# Patient Record
Sex: Female | Born: 1940
Health system: Southern US, Community
[De-identification: ages and names within clinical notes are randomized; demographics above are authoritative.]

## PROBLEM LIST (undated history)

## (undated) DIAGNOSIS — I447 Left bundle-branch block, unspecified: Secondary | ICD-10-CM

## (undated) DIAGNOSIS — I2510 Atherosclerotic heart disease of native coronary artery without angina pectoris: Secondary | ICD-10-CM

## (undated) DIAGNOSIS — I6529 Occlusion and stenosis of unspecified carotid artery: Secondary | ICD-10-CM

## (undated) DIAGNOSIS — E785 Hyperlipidemia, unspecified: Secondary | ICD-10-CM

## (undated) DIAGNOSIS — I10 Essential (primary) hypertension: Secondary | ICD-10-CM

## (undated) DIAGNOSIS — R600 Localized edema: Secondary | ICD-10-CM

## (undated) DIAGNOSIS — I429 Cardiomyopathy, unspecified: Secondary | ICD-10-CM

## (undated) DIAGNOSIS — K219 Gastro-esophageal reflux disease without esophagitis: Secondary | ICD-10-CM

## (undated) DIAGNOSIS — E119 Type 2 diabetes mellitus without complications: Secondary | ICD-10-CM

## (undated) HISTORY — PX: CORONARY ARTERY BYPASS GRAFT: SHX141

## (undated) HISTORY — PX: ABDOMINAL HYSTERECTOMY: SHX81

---

## 2005-03-13 ENCOUNTER — Encounter: Admission: RE | Admit: 2005-03-13 | Discharge: 2005-03-13 | Payer: Self-pay | Admitting: Internal Medicine

## 2005-05-06 ENCOUNTER — Ambulatory Visit: Payer: Self-pay | Admitting: Cardiology

## 2007-03-10 ENCOUNTER — Ambulatory Visit: Payer: Self-pay | Admitting: Infectious Diseases

## 2008-04-22 ENCOUNTER — Ambulatory Visit: Payer: Self-pay | Admitting: Cardiology

## 2008-04-23 ENCOUNTER — Inpatient Hospital Stay (HOSPITAL_COMMUNITY): Admission: AD | Admit: 2008-04-23 | Discharge: 2008-05-03 | Payer: Self-pay | Admitting: Internal Medicine

## 2008-04-23 ENCOUNTER — Ambulatory Visit: Payer: Self-pay | Admitting: Internal Medicine

## 2008-04-26 ENCOUNTER — Encounter: Payer: Self-pay | Admitting: Internal Medicine

## 2008-04-27 ENCOUNTER — Encounter: Payer: Self-pay | Admitting: Internal Medicine

## 2008-04-28 ENCOUNTER — Ambulatory Visit: Payer: Self-pay | Admitting: Thoracic Surgery (Cardiothoracic Vascular Surgery)

## 2008-05-23 ENCOUNTER — Ambulatory Visit: Payer: Self-pay | Admitting: Cardiology

## 2008-05-26 ENCOUNTER — Encounter
Admission: RE | Admit: 2008-05-26 | Discharge: 2008-05-26 | Payer: Self-pay | Admitting: Thoracic Surgery (Cardiothoracic Vascular Surgery)

## 2008-05-26 ENCOUNTER — Ambulatory Visit: Payer: Self-pay | Admitting: Thoracic Surgery (Cardiothoracic Vascular Surgery)

## 2008-06-22 ENCOUNTER — Ambulatory Visit: Payer: Self-pay | Admitting: Cardiology

## 2009-07-17 DIAGNOSIS — Z951 Presence of aortocoronary bypass graft: Secondary | ICD-10-CM

## 2009-07-17 DIAGNOSIS — I25110 Atherosclerotic heart disease of native coronary artery with unstable angina pectoris: Secondary | ICD-10-CM | POA: Diagnosis present

## 2010-07-21 IMAGING — CR DG CHEST 2V
1 series · 1 of 1 positions shown · non-contrast
Comparison: 04/24/2008

CLINICAL DATA: Heart failure

CHEST - 2 VIEW

[w chest lat]
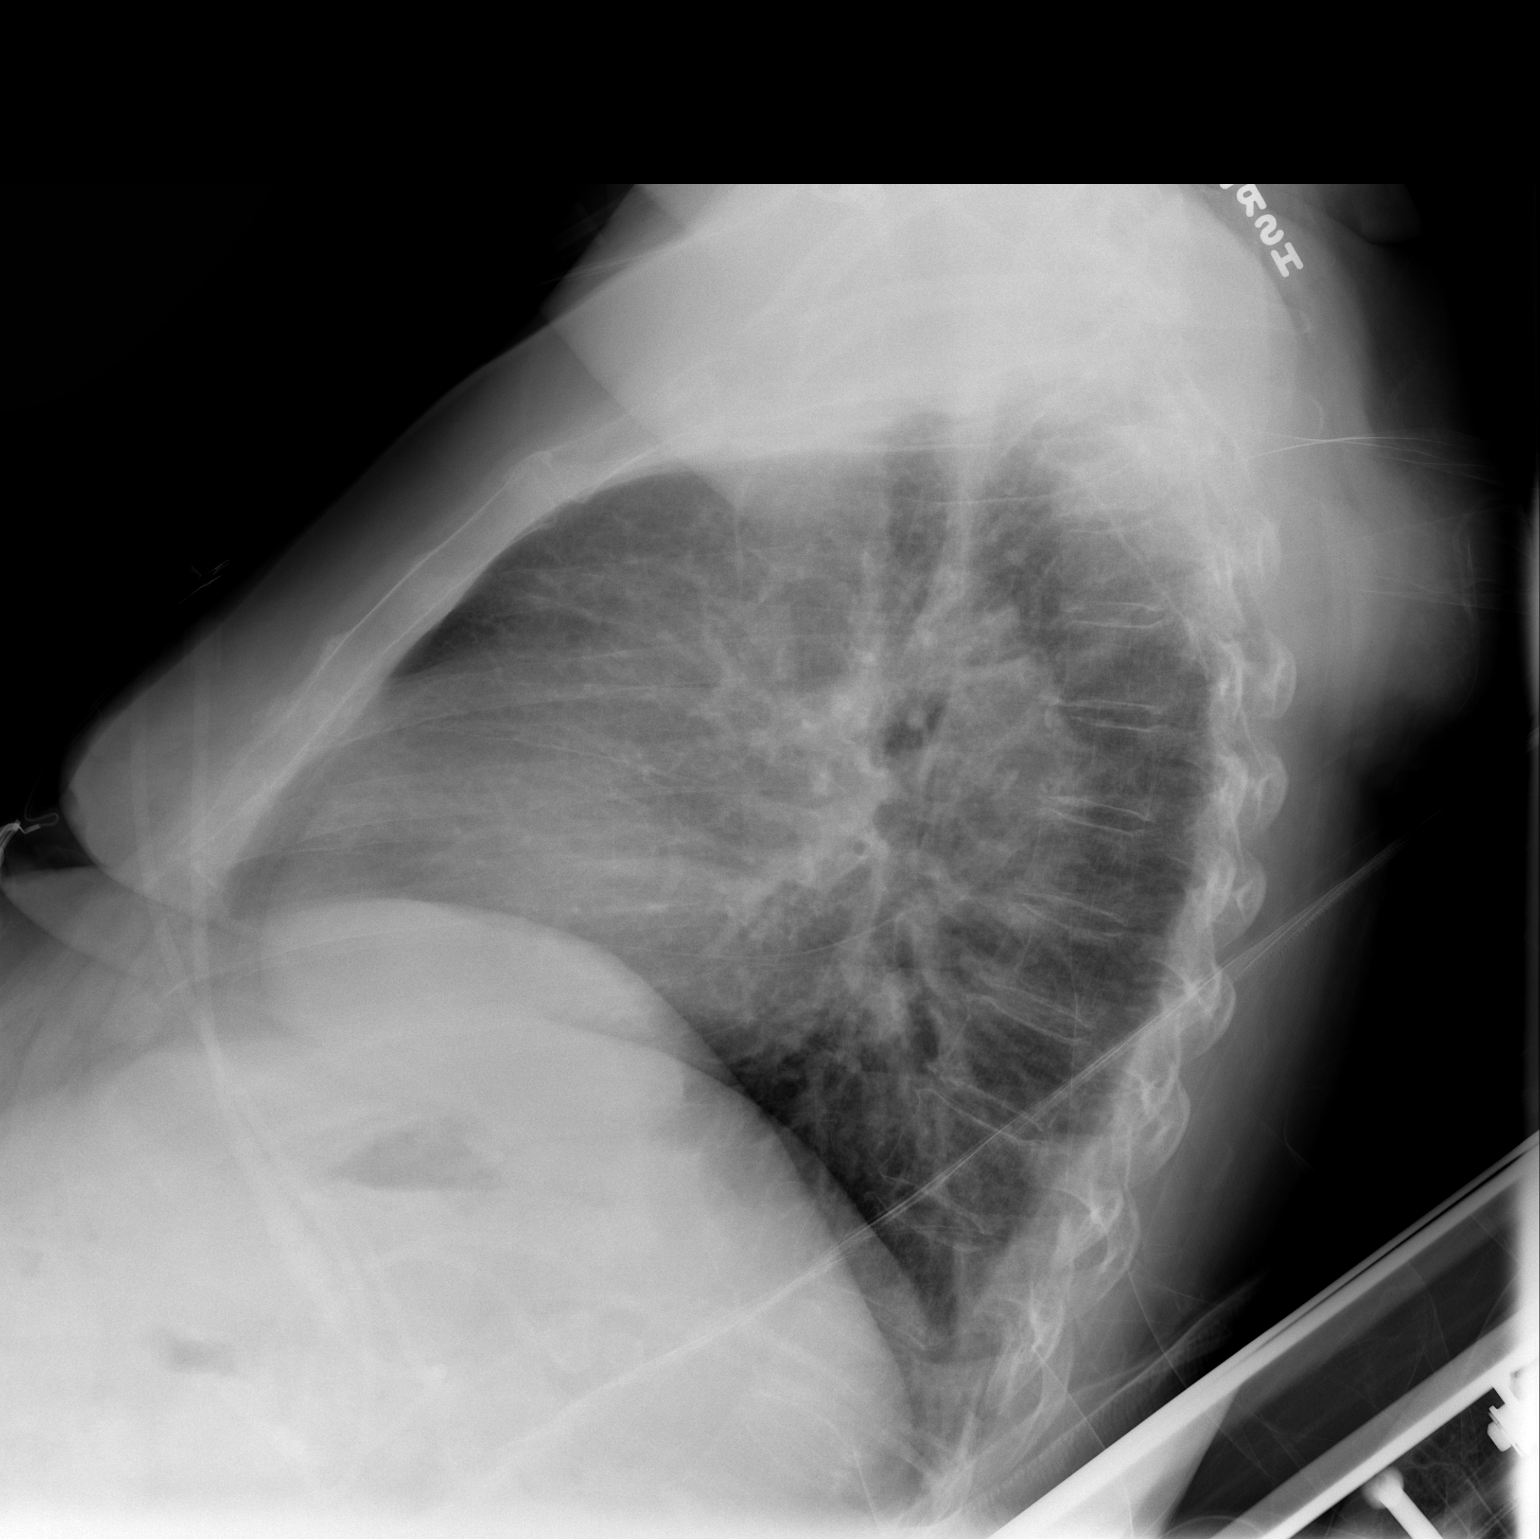

[1 of 1 positions shown; findings below may reference images not displayed]

FINDINGS: Heart size is mildly enlarged.

No pleural effusion or pulmonary interstitial edema.

No airspace densities are identified.

Review of the visualized osseous structures shows no acute
findings.
IMPRESSION: 1.  No acute cardiopulmonary abnormalities

## 2010-07-24 IMAGING — CR DG CHEST 1V PORT
1 series · 1 of 1 positions shown · non-contrast
Comparison: 04/29/2008

CLINICAL DATA: Postop CABG procedure

PORTABLE CHEST - 1 VIEW

[view not recorded]
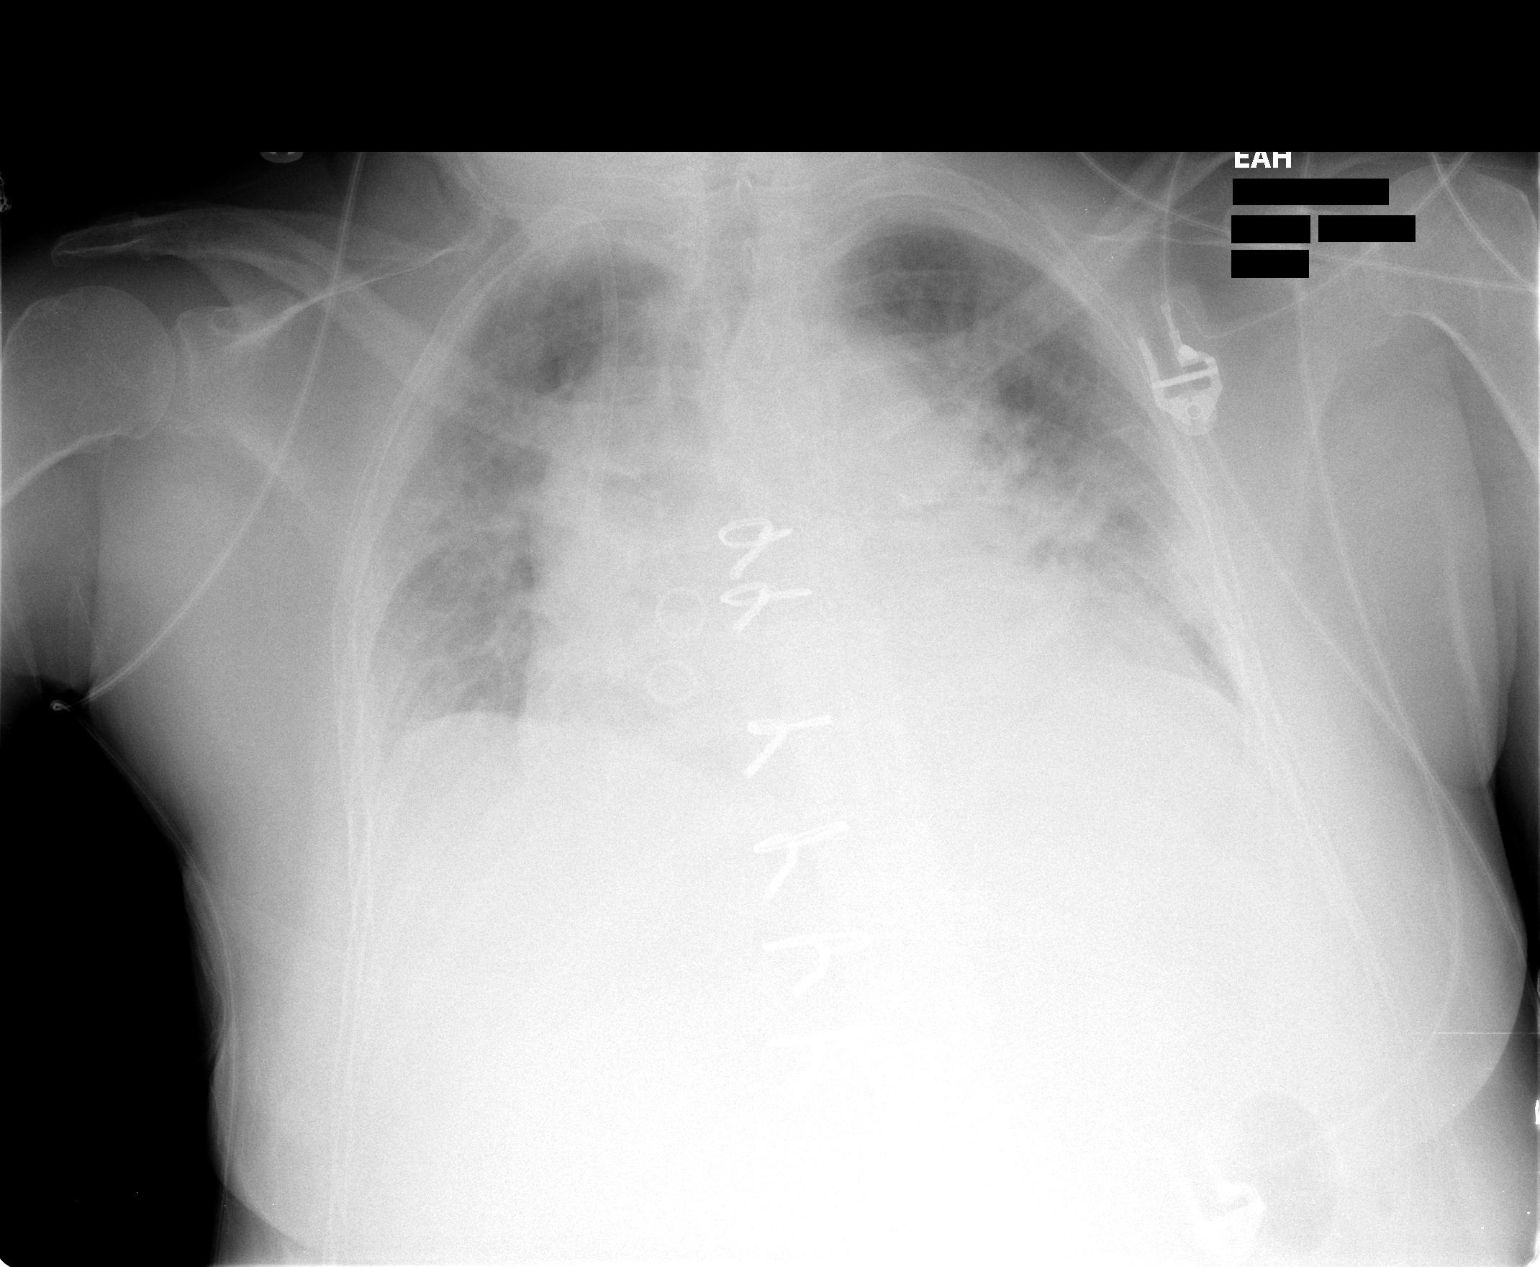

[1 of 1 positions shown; findings below may reference images not displayed]

FINDINGS: The Swan-Ganz catheter and left chest tube have been
removed.  Negative for pneumothorax.  There is progressive
atelectasis with associated increased edema.
IMPRESSION: Negative pneumothorax.  Progressive atelectasis with increased
edema.

## 2010-08-19 IMAGING — CR DG CHEST 2V
2 series · 2 of 2 positions shown · non-contrast
Comparison: Chest x-ray of 05/01/2008

CLINICAL DATA: CABG, follow-up

CHEST - 2 VIEW

[w chest pa]
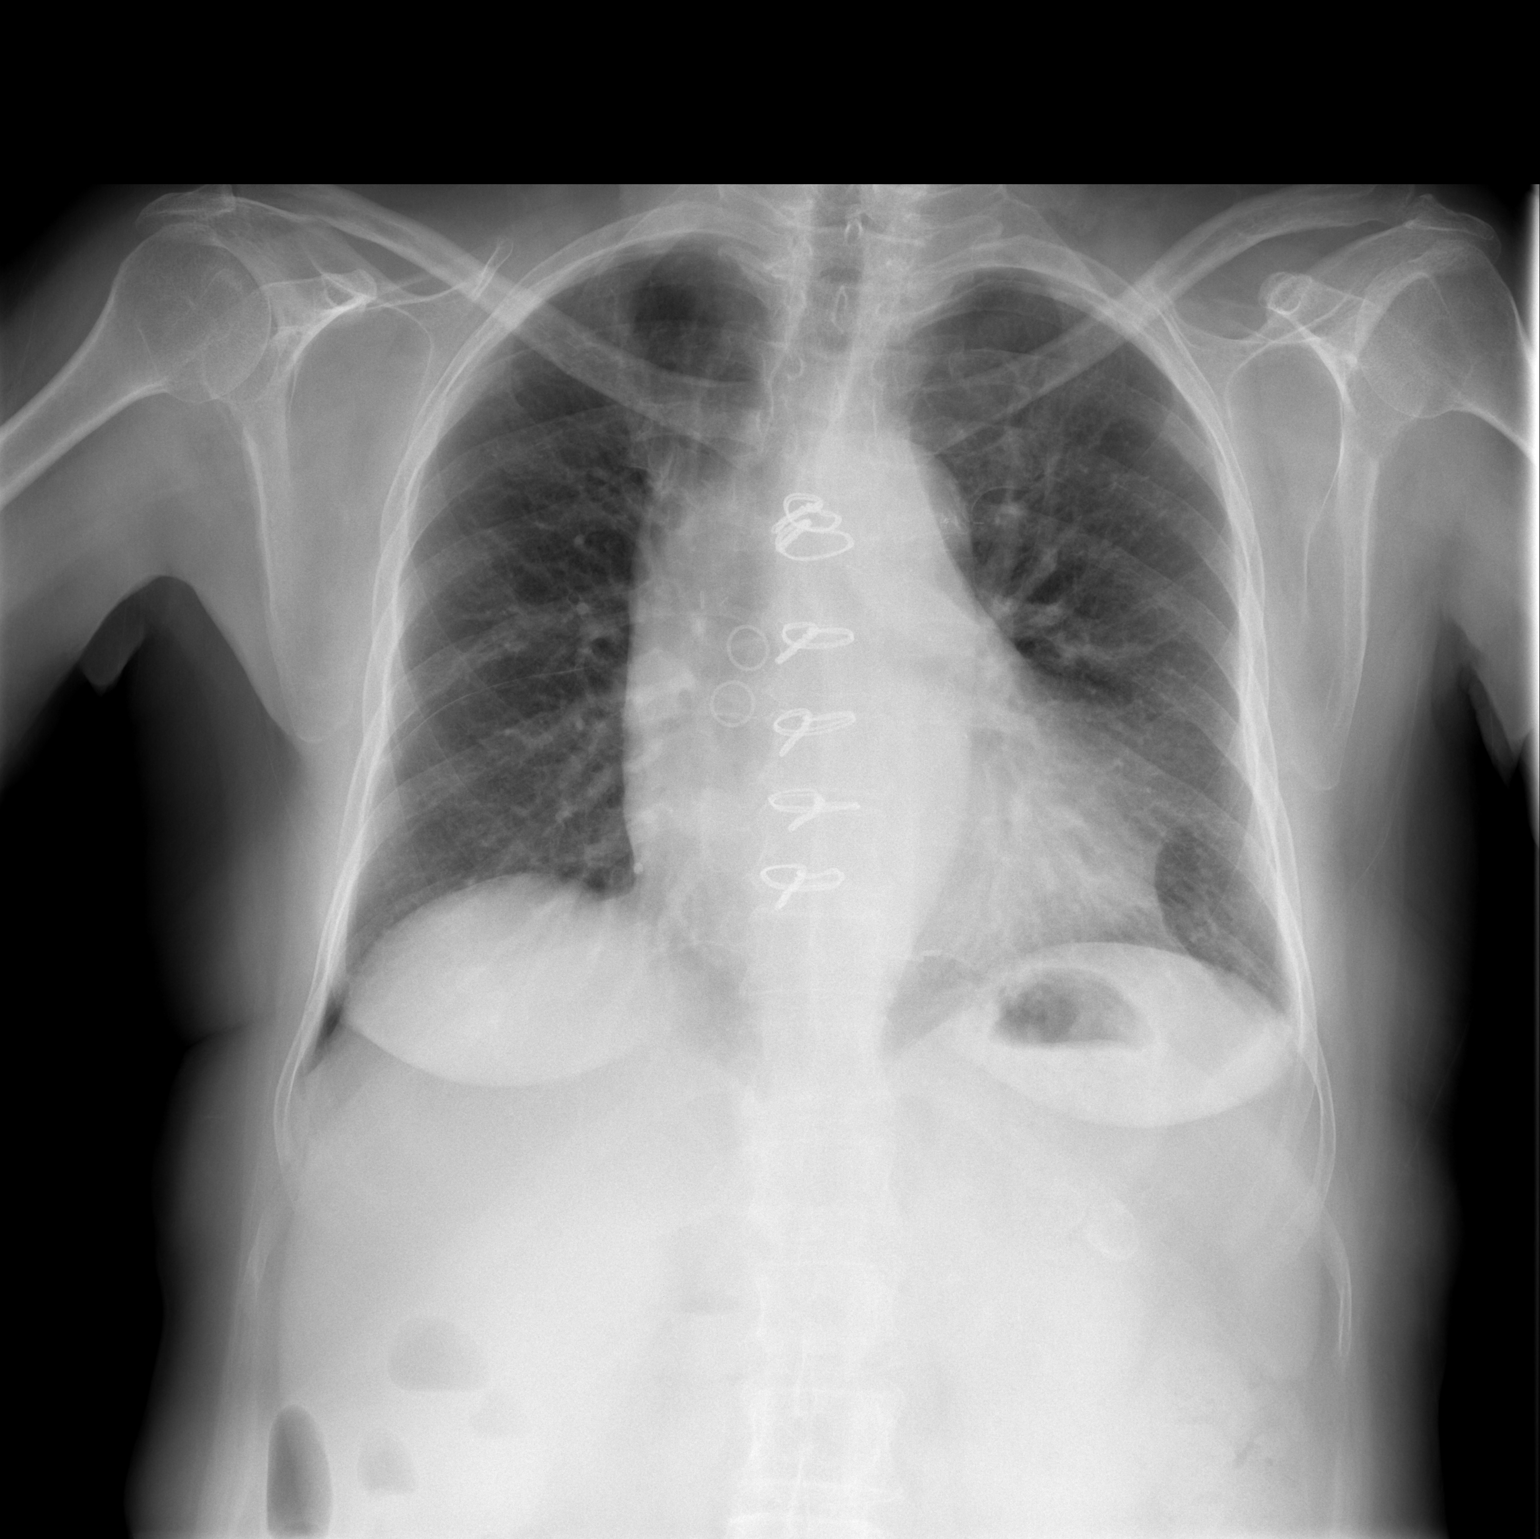

[w chest lat]
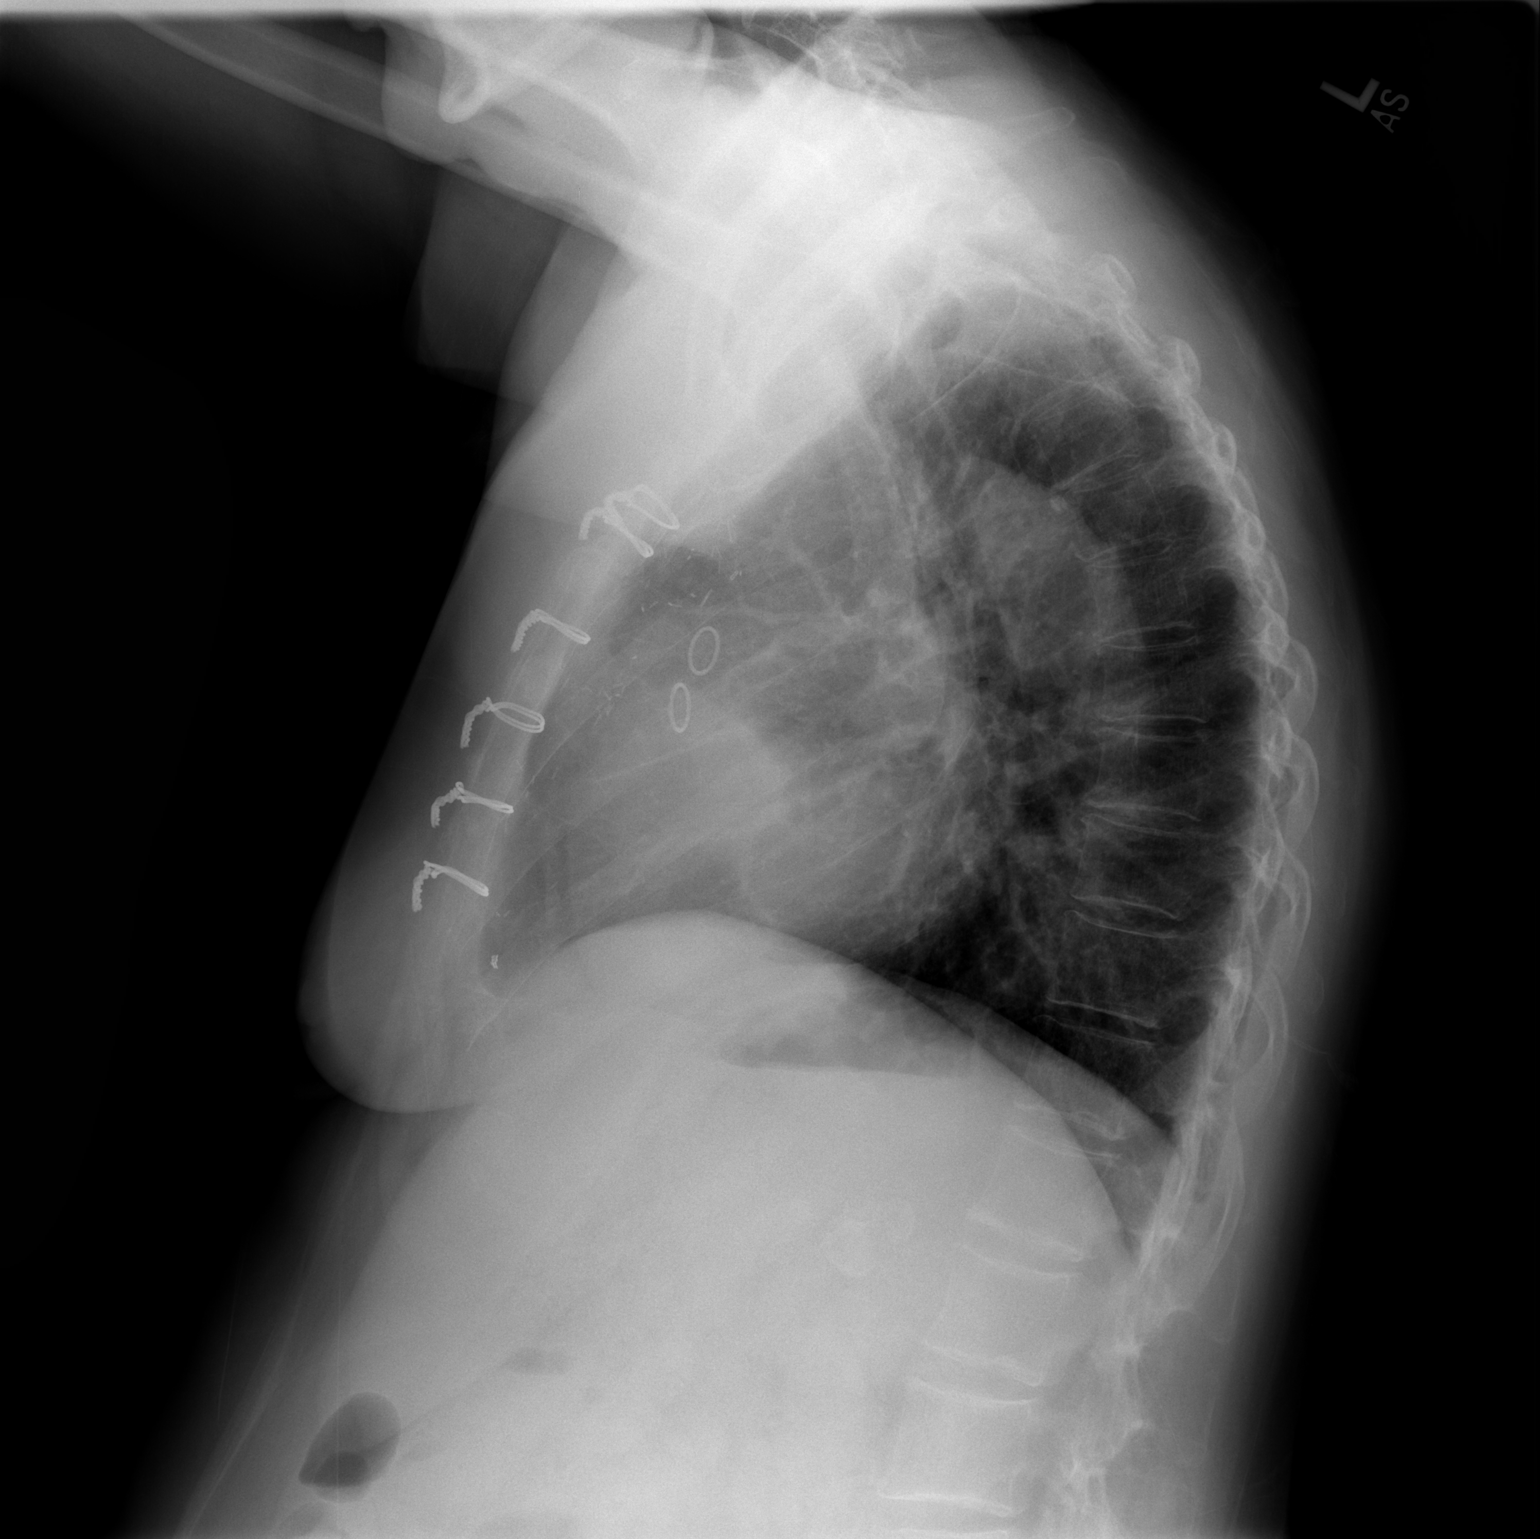

[2 of 2 positions shown; findings below may reference images not displayed]

FINDINGS: Aeration has improved and pulmonary vascular congestion
has resolved.  The heart is mildly enlarged and stable.  Median
sternotomy sutures are noted.
IMPRESSION: No active lung disease.  Resolution of congestion with improved
aeration.

## 2011-01-01 NOTE — Discharge Summary (Signed)
[name], [name]             ACCOUNT NO.:  [id number]   MEDICAL RECORD NO.:  [id number]          PATIENT TYPE:  INP   LOCATION:  2016                         FACILITY:  [facility]   PHYSICIAN:  Salvatore Decent. Dorris Fetch, M.D.DATE OF BIRTH:  [birth year]   DATE OF ADMISSION:  04/23/2008  DATE OF DISCHARGE:                               DISCHARGE SUMMARY   FINAL DIAGNOSIS:  A 3-vessel coronary artery disease with unstable  coronary syndrome.   IN-HOSPITAL DIAGNOSES:  1. Right 40-60% internal carotid artery stenosis.  2. Volume overload postoperatively.  3. Acute blood loss anemia postoperatively.   SECONDARY DIAGNOSES:  1. Congestive heart failure.  2. Nonischemic cardiomyopathy.  3. Type 2 diabetes.  4. Hypertension.  5. Dyslipidemia.  6. Gastroesophageal reflux disease with previous esophageal stricture      status post dilatation.  7. Peripheral edema.  8. Morbid obesity.  9. Status post hysterectomy.   IN-HOSPITAL OPERATIONS AND PROCEDURES:  1. Cardiac catheterization.  2. Coronary artery bypass grafting x3 using a left internal mammary      artery to left anterior descending coronary artery, saphenous vein      graft to acute marginal, saphenous vein graft to obtuse marginal.      Endoscopic vein harvest from the right leg was done.   THE PATIENT'S HISTORY AND PHYSICAL AND HOSPITAL COURSE:  [name] is a  70 year old female with a history of nonischemic cardiomyopathy  previously diagnosed.  She presents with unstable anginal symptoms, and  on cardiac catheterization, she was found to have severe 3-vessel  coronary artery disease.  The patient was advised to undergo coronary  artery bypass grafting.  Dr. Dorris Fetch was consulted.  Dr. Dorris Fetch  saw and evaluated the patient.  He discussed risks and benefits of  undergoing coronary artery bypass grafting.  The patient acknowledged  understanding and agreed to proceed.  Surgery was scheduled for  April 28, 2008.   Preoperatively, the patient did have bilateral  carotid duplex ultrasound done showing on the right to have 40-60% ICA  stenosis.  On the left, she had no evidence of significant ICA stenosis.  She also had preoperative ABIs done showing to be bilaterally greater  than 1.0.  The patient did have episode of chest pain evening prior to  surgery, which improved with sublingual nitroglycerin.  For further  details of the patient's past medical history and physical exam, please  see dictated H&P.   The patient was taken to the operating room on April 28, 2008, where  she underwent coronary artery bypass grafting x3 using left internal  mammary artery to left anterior descending, saphenous vein graft to  acute marginal, saphenous vein graft to obtuse marginal one.  Endoscopic  vein harvest from right leg was done.  The patient tolerated this  procedure well and was transferred to the intensive care unit in stable  condition.  Postoperatively, the patient was noted to be hemodynamically  stable.  She was able to be extubated evening of surgery.  Post-  extubation, the patient was noted to be alert and oriented x4.  Neuro  intact.  Postoperatively, the  patient was able to be weaned from all  drips.  She had minimal drainage from chest tubes and all chest tubes  and lines were discontinued in normal fashion.  She was stabilized in  the intensive care unit.  Blood pressure, heart rate were stable and  normal sinus rhythm.  She was out of bed, ambulating well in the cardiac  rehab.  She had some mild volume overload, started on diuretics for.  The patient also has mild anemia, which was stable.  The patient did not  require transfusions.  She was transferred out to the PCTU on postop day  #2.  The patient's postoperative course continued to improve.  She  remained in normal sinus rhythm.  She was able to be started on a low-  dose beta-blocker.  From the patient's pulmonary status, repeat chest  x-  rays remained stable.  She was able to be weaned off oxygen, sating  greater than 90% on room air.  The patient was encouraged using  incentive spirometer.  She was restarted on Pulmicort.  The patient was  continued on her diuretics for volume overload, which was improving  prior to discharge.  The patient was tolerating diet well.  No nausea or  vomiting noted.  All incisions were clean, dry, and intact and healing  well.  The patient is tentatively ready for discharge home in the next  24-48 hours pending she remains stable.   On postop day #4, the patient was noted to be afebrile.  She is in  normal sinus rhythm.  Blood pressure stable.  O2 sats greater than 90%  on room air.  Last labs showed a white count 11.7, hemoglobin 9.6,  hematocrit 29.1, platelet count 169.  BUN and creatinine of 8 and 0.64.  Potassium was 4.2.   FOLLOWUP APPOINTMENTS:  A followup appointment has been arranged with  Dr. Dorris Fetch for May 26, 2008, at 11:15 a.m..  The patient will  need to obtain PA and lateral chest x-ray 30 minutes prior to this  appointment.  The patient will need to follow up with Dr. Diona Browner in 2  weeks.  She will need to contact this office to make these arrangements.  The patient is known diabetic.  Blood sugars were followed  postoperatively and she was restarted on her home diabetic medications  prior to discharge.   ACTIVITY:  The patient is instructed no driving until released to do so,  no heavy lifting over 10 pounds.  She is told to ambulate 3-4 times per  day, progress as tolerated, and continue her breathing exercises.   INCISIONAL CARE:  The patient is told to shower, washing her incisions  using soap and water.  She is to contact the office if she develops any  drainage or opening from any of her incision sites.   DIET:  The patient is to begin on diet to be low-fat, low-salt, as well  as diabetic diet.   DISCHARGE MEDICATIONS:  1. Estradiol 1 mg daily.   2. Lopid 600 mg b.i.d.  3. Pulmicort 180 mcg as used at home.  4. Amaryl 4 mg daily.  5. Singulair 10 mg daily.  6. Neurontin 600 mg t.i.d.  7. Prilosec 20 mg b.i.d.  8. Zocor 10 mg at night.  9. Actos 15 mg daily.  10.Symbicort 160 mcg as used at home.  11.Metformin 500 mg b.i.d.  12.Aspirin 325 mg daily.  13.Toprol-XL 25 mg daily.  14.Oxycodone 5 mg 1-2 tabs q.4-6 h.  p.r.n.  15.Lisinopril 10 mg daily      Kristen M Fishers, [place]      Salvatore Decent. Dorris Fetch, M.D.  Electronically Signed    KMD/MEDQ  D:  05/02/2008  T:  05/03/2008  Job:  [id number]   cc:   Jonelle Sidle, MD

## 2011-01-01 NOTE — Assessment & Plan Note (Signed)
[facility]                          [facility] OFFICE NOTE   [name], [name]                    MRN:          [id number]  DATE:05/23/2008                            DOB:          [birth year]    PRIMARY CARDIOLOGIST:  Learta Codding, MD, [facility]   REASON FOR VISIT:  Post hospital followup.   HISTORY:  [name] is a very pleasant 70 year old female, recently  referred to [place] in consultation here at [facility] following  presentation with acute systolic heart failure.  A 2D echo showed severe  LVD (EF 25-30%) with normal right ventricular function.  Moreover, the  patient presented with worsening shortness of breath, was felt to be in  acute systolic heart failure, and had an abnormal troponin of 0.64.  Additionally, she presented with a new left bundle branch block.  She  was thus referred directly to [facility] for aggressive management and  subsequent coronary angiography.  Of note, she had not been seen by [place]  in the clinic for quite some time, and had prior history of nonischemic  cardiomyopathy, per prior workup at [facility].  Subsequent studies done here  at [facility], however, suggested normalization of LVF both by  echocardiography as well as nuclear imaging studies in 2004 and 2006.   Following transfer to [facility], cardiac catheterization by Dr. Charlies Constable suggested severe three-vessel CAD and severe LVD (EF 30%) with  global hypokinesis.  There were normal LV filling pressures.   The patient was subsequently referred for surgical evaluation and  underwent successful three-vessel CABG, by Dr. Charlett Lango, on  April 28, 2008, with grafting of the LIMA - LAD; SVG - acute  marginal; and, SVG - obtuse marginal branch.   Postop course was absent of any documented dysrhythmias.  Preoperative  evaluation notable for 40-60% right ICA stenosis, with normal ABIs.   Clinically, Mr. Savoia has been doing extremely well and  reports that  she feels great.  She presents with no sign/symptoms suggestive either  of unstable angina pectoris or decompensated heart failure.  She has  completed physical therapy at home and is tolerating her medications,  with no noted adverse effects.   Notably, the patient presents with a weight of 149, which compares to  her recent hospitalization weight of 165.   EKG today indicates an SR at 72 bpm with LAD and incomplete LBBB with  persistent downsloping ST-segment depression in the high lateral leads.   CURRENT MEDICATIONS:  1. Full-dose aspirin.  2. Lisinopril 10 daily.  3. Glimepiride 4 daily.  4. Simvastatin 10 nightly.  5. Metoprolol ER 25 daily.  6. Actos 15 daily.  7. Gemfibrozil 600 b.i.d.  8. Lasix 40 daily.  9. Metformin 500 b.i.d.  10.Omeprazole 20 b.i.d.   PHYSICAL EXAMINATION:  VITAL SIGNS:  Blood pressure 114/65, pulse 71 and  regular, weight 149.  GENERAL:  A 70 year old female, sitting upright, in no distress.  HEENT:  Normocephalic and atraumatic.  NECK:  No JVD.  LUNGS:  Clear to auscultation in all fields.  HEART:  Regular rate and rhythm.  No  significant murmurs.  No rubs.  ABDOMEN:  Soft and nontender.  EXTREMITIES:  Trace pedal edema.  NEURO:  No focal deficit.   IMPRESSION:  1. Severe ischemic cardiomyopathy.      a.     Ejection fraction approximately 30% with global hypokinesis.      b.     Severe three-vessel coronary artery disease, status post       three-vessel coronary artery bypass graft, September 2009.      c.     Prior history of nonischemic cardiomyopathy.  2. Left bundle branch block.  3. Type 2 diabetes mellitus.  4. Hypertension.  5. Mixed dyslipidemia.      a.     Recent lipid profile:  Total cholesterol 200, triglyceride       226, HDL 33 ,and LDL 123.  6. History of lower extremity edema.  7. Obesity.   PLAN:  1. Adjust current medications as follows:  Down titrate aspirin to 81      mg daily, up titrate Zocor to  40 mg nightly for more aggressive      lipid management, and substitute the metoprolol ER (following      completion) with Coreg 3.125 b.i.d.  For reasons that are unclear,      Mr. Simi was taken off Coreg, which we had started here at      [facility] prior to her transfer to [facility].  Given the severity      of her cardiomyopathy, we feel that this will be most beneficial to      her.  We will start at the initial low dose and check a followup      blood pressure/pulse in 2 weeks.  If this is stable, then we will      up titrate to 6.25 b.i.d.  We will also continue the current dose      of the ACE inhibitor.  2. Followup BMET now, for monitoring of electrolytes and renal      function.  The patient will need a followup fasting lipid/liver      profile in 12 weeks, for reassessment of lipid status.  3. The patient will reestablish with Dr. Andee Lineman here in the clinic in      approximately 3 months.  She had been seen by him years ago, when      she was last seen in the clinic.  4. The patient is strongly advised to schedule a followup with her      primary care physician, Dr. Doreen Beam, for continued close      monitoring and management of diabetes mellitus.  Moreover, she also      has noticed some recent mild dysuria and I encouraged her to have      this evaluated further.      Rozell Searing, PA-C  Electronically Signed      Jonelle Sidle, MD  Electronically Signed   GS/MedQ  DD: 05/23/2008  DT: 05/24/2008  Job #: [id number]   cc:   Doreen Beam, MD  Salvatore Decent Dorris Fetch, M.D.

## 2011-01-01 NOTE — Consult Note (Signed)
NAMEGERRY, HEAPHY             ACCOUNT NO.:  [id number]   MEDICAL RECORD NO.:  [id number]          PATIENT TYPE:  INP   LOCATION:  3714                         FACILITY:  [facility]   PHYSICIAN:  Salvatore Decent. Dorris Fetch, M.D.DATE OF BIRTH:  27-Aug-1940   DATE OF CONSULTATION:  DATE OF DISCHARGE:                                 CONSULTATION   HISTORY OF PRESENT ILLNESS:  Ms. Nordling is a 70 year old woman who was  admitted to [facility] on April 22, 2008, with complaint of  chest pain and shortness of breath.  She says that this had been  happening to a lesser degree over the several weeks leading up to her  hospitalization.  She had a spell one day while driving her car where  she felt like she could not breathe.  She had multiple other spells,  finally became so severe that she felt like she might die.  Her husband  talked her into going to the emergency room.  She was admitted and found  to be short of breath and felt that she might have chronic obstructive  pulmonary disease exacerbation.  She was treated with steroids,  antibiotics, but also had cardiac enzymes performed.  CPK-MB was normal.  Her troponin was mildly elevated at 0.64.  She had an elevated BNP.  She  was treated with intravenous Lasix and had improvement in her breathing.  She was treated with heparin and aspirin.  She was transferred to [facility] and today underwent cardiac catheterization right, and left heart  catheterization was performed, it showed severe three-vessel coronary  disease.  Ejection fraction was 30% with global hypokinesis.  Her index  was 2.3.  PA pressures were within normal limits.  The patient currently  is pain-free.   PAST MEDICAL HISTORY:  1. Congestive heart failure.  2. Nonischemic cardiomyopathy.  3. Type 2 diabetes.  4. Hypertension.  5. Dyslipidemia.  6. Gastroesophageal reflux, previous esophageal stricture status post      dilatation.  7. Peripheral edema.  8. Morbid  obesity.  9. Previous hysterectomy.   CURRENT MEDICATIONS:  1. Lisinopril 2.5 mg daily.  2. Zocor 40 mg every evening.  3. Neurontin 600 mg t.i.d.  4. Coreg 3.125 mg b.i.d.  5. Protonix 40 mg b.i.d.  6. Amaryl 4 mg daily.  7. Singulair 10 mg daily.  8. Aspirin 325 mg daily.  9. Sliding scale insulin.  10.Lasix 40 mg b.i.d.  11.Potassium 20 mEq b.i.d.  12.Lovenox 40 mg subcu daily.  13.Heparin intravenously.   ALLERGIES:  She has an allergy to NIACIN.   FAMILY HISTORY:  Has a sister with coronary disease.  Mother had  coronary disease.   SOCIAL HISTORY:  She is married.  She lives with her husband.  She does  not use tobacco or alcohol.  Her husband previously had bypass surgery.   REVIEW OF SYSTEMS:  The patient states that she had been having  shortness of breath, orthopnea, paroxysmal nocturnal dyspnea, and  several episodes where she felt lightheaded.  Denies any history of  excessive bleeding or bruising.  She is unsure about her  reflux symptoms  or previous esophageal dilatation.  All other systems are negative.  Of  note, the patient is a poor historian.   PHYSICAL EXAMINATION:  GENERAL:  Ms. Prigmore is an obese 70 year old  white female in no acute distress.  In general, her neurologic exam:  She is alert, and oriented x3, with no focal deficits.  HEENT:  Unremarkable.  She is wearing glasses.  NECK:  Supple without thyromegaly, adenopathy or bruits.  CARDIAC:  Regular rate and rhythm.  Normal S1, and S2.  I did not hear  any rub, murmur, or gallop.  ABDOMEN:  Obese, soft, and nontender.  EXTREMITIES:  Without clubbing, cyanosis, or edema.  She has 2+ pulses,  which is symmetrical bilaterally.   LABORATORY DATA:  EKG shows left ventricular hypertrophy.  There is QRS  widening and repolarization abnormality.  Her hematocrit is 32.  White  count 8.6, and platelets 325.  Sodium 140, potassium 3.8, BUN and  creatinine are 20 and 1.2, glucose 237, troponin was 0.64.   CK 59, MB  3.0, and BNP was 864.   IMPRESSION:  Ms. Berland is a 70 year old woman with a preexisting  diagnosis of nonischemic cardiomyopathy.  She presents with unstable  anginal symptoms, and a catheterization has severe three-vessel coronary  disease with impaired left ventricular function.  Coronary bypass  grafting is indicated for survival benefit as well as relief of  symptoms.  I discussed in detail with the patient and her husband and  his sister the indications, risks, benefits, and alternatives.  They  understand the general nature of the operation including the incisions  to be used, need for cardiopulmonary bypass, and need for general  anesthesia.  They understand the expected hospital stay and overall  recovery.  They understand the risks include but are not limited to  death, stroke, myocardial infarction, deep venous thrombosis, pulmonary  embolus, bleeding, possible need for transfusions, infections as well as  other organ system dysfunction including respiratory, renal, hepatic or  gastrointestinal complications.  She understands and accepts these risks  and agrees to proceed.  She is tentatively scheduled for the operating  room on Thursday, April 28, 2008.      Salvatore Decent Dorris Fetch, M.D.  Electronically Signed     SCH/MEDQ  D:  04/26/2008  T:  04/27/2008  Job:  [id number]   cc:   Jonelle Sidle, MD  Doreen Beam, MD

## 2011-01-01 NOTE — Op Note (Signed)
[name], [name]             ACCOUNT NO.:  [id number]   MEDICAL RECORD NO.:  [id number]          PATIENT TYPE:  INP   LOCATION:  2301                         FACILITY:  [facility]   PHYSICIAN:  Salvatore Decent. Dorris Fetch, M.D.DATE OF BIRTH:  06-12-1941   DATE OF PROCEDURE:  04/28/2008  DATE OF DISCHARGE:                               OPERATIVE REPORT   PREOPERATIVE DIAGNOSIS:  Three-vessel coronary artery disease with  unstable coronary syndrome.   POSTOPERATIVE DIAGNOSIS:  Three-vessel coronary artery disease with  unstable coronary syndrome.   PROCEDURE:  1. Median sternotomy.  2. Extracorporeal circulation coronary artery bypass grafting x3 (left      internal mammary artery to left anterior descending, saphenous vein      graft to acute marginal, saphenous vein graft to obtuse marginal      I).  3. Endoscopic vein harvest, right leg.   SURGEON:  Salvatore Decent. Dorris Fetch, MD   ASSISTANT:  Coral Ceo, PA   ANESTHESIA:  General.   FINDINGS:  Vein thin-walled, but satisfactory mammary good quality, good  targets.  Cardiopulmonary bypass reinstituted due to air in vein grafts.  Transesophageal echocardiography revealed aneurysmal atrial septum, but  no shunt was demonstrable, global hypokinesis.   CLINICAL NOTE:  Ms. Birdwell is 70 year old woman with a history of  nonischemic cardiomyopathy, previously diagnosed.  She presented with  unstable anginal symptoms and on catheterization, she was found to have  severe 3-vessel coronary disease.  She was advised to undergo coronary  bypass grafting.  Indications, risks, benefits, and alternatives were  discussed in detail with the patient.  She understood and accepts the  risks, and agreed to proceed.   OPERATIVE NOTE:  Ms. Steinmetz was brought to the preop holding area on  April 28, 2008.  There, the Anesthesia placed a Swan-Ganz catheter  and arterial blood pressure monitoring catheter.  Intravenous  antibiotics were  administered.  She was taken to the operating room,  anesthetized, and intubated.  A Foley catheter was placed.  The chest,  abdomen, and legs were prepped and draped in usual fashion.  An incision  was made in the medial aspect the right leg at the level of the knee.  The greater saphenous vein was identified and was harvested  endoscopically.  Then, 2000 units of heparin was administered during the  vein harvest.  The vein was relatively thin-walled, but was satisfactory  and was of good size.  Simultaneously, a median sternotomy was  performed.  There was sternal osteoporosis.  The left internal mammary  artery was harvested in standard fashion.  This was of atypical size  approximately 1.5 to 2 mm in diameter, did have excellent flow and  divided distally.   Remaining of the full heparin dose was administered.  Anticoagulation  was monitored with ACT measurement during the procedure.  The  pericardium was opened.  The ascending aorta was inspected.  There was  no evidence of atherosclerotic disease.  The aorta was cannulated via  concentric 2-0 Ethibond pledgeted pursestring sutures.  A dual stage  venous cannula was placed via pursestring suture in the right atrial  appendage.  Cardiopulmonary bypass was instituted and the patient was  cooled to 32 degrees Celsius.  The coronary arteries were inspected and  anastomotic sites were chosen.  The conduits were inspected and cut to  length.  A foam pad was placed in the pericardium to protect the left  phrenic nerve.  The temperature probe was placed in myocardial septum.  A cardioplegic cannula was placed in the ascending aorta.   The aorta was cross clamped.  The left ventricle was emptied via the  aortic root vent.  Cardiac arrest was then achieved with combination of  cold antegrade blood cardioplegia and topical iced saline.  After  achieving a complete diastolic arrest and adequate myocardial septal  cooling to less than 10  degrees Celsius with 1-liter of cardioplegia.  The following distal anastomoses were performed.   First, a reversed saphenous vein graft was placed end-to-side to acute  marginal branch of the right coronary.  The right coronary had  approximately 70% stenosis in its midportion and then bifurcated  relatively high and gave rise to an acute marginal, which traversed the  acute margin and the inferior wall pushing towards the ventricular  septum.  The distal right coronary was heavily diseased and its terminal  posterior lateral branches were non-graftable.  The acute marginal was  grafted right at the acute margin of the heart.  This was 1.5-mm good-  quality target site and 1.5-mm probe passed distally into the vessel.  The vein graft was anastomosed end-to-side with a running 7-0 Prolene  suture.  There was good flow through the graft at completion and good  hemostasis.   Next, a reversed saphenous vein graft was placed end-to-side to obtuse  marginal I.  This was a large dominant obtuse marginal branch that  bifurcated just beyond the anastomosis.  It was a 2 to 2.5 mm vessel  site anastomosis.  There was some plaquing, but no hemodynamically  significant disease.  There was a tight stenosis just proximally to  anastomosis.  The vein graft was anastomosed end-to-side with a running  7-0 Prolene suture.   Next, the left internal mammary artery was brought through a window in  the pericardium.  The distal end was beveled.  It was then anastomosed  end-to-side to mid LAD.  The LAD had a tight ostial stenosis.  There was  some plaquing distally.  The 1.5 mm probe did pass up to the apex.  The  mammary was anastomosed end-to-side with a running 8-0 Prolene suture.  At the completion of the mammary to LAD anastomosis, the bulldog clamps  were briefly removed to inspect for hemostasis and immediate rapid  septal rewarming was noted.  The bulldog clamps was replaced.  The  mammary pedicle  was tacked up to the cardial surface of the heart with 6-  0 Prolene sutures.   Additional cardioplegia was administered.  The vein grafts were cut to  length.  A cardioplegic cannula was removed from the ascending aorta and  proximal vein graft anastomoses.  Then, we performed a 4.5 mm punch  aortotomies with running 6-0 Prolene sutures.  At completion of the  final proximal anastomosis, lidocaine was administered.  The patient was  placed in Trendelenburg position.  The heart was filled with blood.  The  lungs were inflated and the aortic root was de-aired.  After there was  no more visible air, the aortic crossclamp was removed.  The total  crossclamp time was 58 minutes.  While the patient was being rewarmed, all proximal and distal  anastomoses were inspected for hemostasis.  Epicardial pacing wires were  placed on the right ventricle and right atrium, and the patient had  rewarmed to a core temperature of 37 degrees Celsius.  She was started  on a low-dose dopamine infusion at 3 mcg/kg/minute, and was weaned from  cardiopulmonary bypass on the first attempt.  Shortly after weaning from  bypass, within approximately 2 minutes, the patient developed  hypotension.  Inspection revealed extensive air in the grafts, a fine  needle was used to aspirate the air.  There also was air noted in the  aortic cannula and this was being evacuated.  The patient developed  ventricular tachycardia.  The air was evacuated from the arterial line.  Cardiac compression was done briefly.  A venous cannula was replaced and  cardiopulmonary bypass was reinstituted.  The time from separation from  bypass to reinstitution bypass was 6 minutes.  The remainder of the air  was then evacuated from the vein grafts.  An Angiocath was placed into  the left ventricular apex, and additional air was evacuated from the  left ventricular cavity.  A transesophageal echocardiography probe was  passed.  On inspection,  when first passed, there was some visible air  bubbles within the left ventricle.  These resolved after deairing  through the apex.  Attempts demonstrate a PFO with agitated saline,  which showed no evidence of a right-to-left shunt, even though the  atrial septum was aneurysmal.  The patient was allowed to rest for  approximately 20 minutes.  She did have some arrhythmias, which resolved  with additional 100 mg of lidocaine.  The patient was maintained at 37  degrees Celsius.  She then was weaned from cardiopulmonary bypass  without difficulty on the second attempt.  She was on 5 mcg/kg of  dopamine on second wean from bypass.  Initial cardiac index was  approximately 1.6 L/min/m2.  The patient responded appropriately to  volume resuscitation and continued to improve throughout post bypass.  Post bypass transesophageal echocardiography revealed global hypokinesis  consistent with preoperative findings.  There was no residual air  identified.  A test dose of protamine was administered and was well  tolerated.  The atrial and aortic cannulae were removed.  The remainder  of protamine was administered without incident.  Chest was irrigated  with 1 L of warm normal saline containing 1 g of vancomycin.  Pericardium could not be reapproximated.  The left pleural and 2  mediastinal chest tubes were placed in separate subcostal incisions.  The sternum was closed with interrupted double heavy gauge stainless  steel wires.  Pectoralis fascia, subcutaneous tissue, and skin were  closed in standard fashion.  All sponge, needle, and instrument counts  were correct at the end of procedure.  The patient was taken from the  operating room to the Surgical Intensive Care Unit in critical and  stable condition.      Salvatore Decent Dorris Fetch, M.D.  Electronically Signed     SCH/MEDQ  D:  04/28/2008  T:  04/29/2008  Job:  [id number]   cc:   Doreen Beam, MD  Jonelle Sidle, MD

## 2011-01-01 NOTE — Assessment & Plan Note (Signed)
OFFICE VISIT   CHENE, KASINGER A  DOB:  Apr 27, 1941                                        May 26, 2008  CHART #:  [id number]   The patient is a 70 year old woman, who presented with unstable coronary  syndrome.  She had three-vessel disease and underwent coronary artery  bypass grafting x3 on April 28, 2008.  Postoperatively, she did not  have any significant complications.  She now returns for her scheduled  postop followup visit.  The patient says she has not had any recurrent  anginal-type symptoms.  She does have some mild incisional pain in her  sternum.  She stopped taking oxycodone because it was causing side  effects.  She has not really been taking anything for the pain.  She has  been walking on a regular basis.  She did not note any swelling in her  legs or shortness of breath.   PHYSICAL EXAMINATION:  GENERAL:  The patient is a 70 year old woman in  no acute distress.  VITAL SIGNS:  Blood pressure 120/70, pulse 70, respirations 18, her  oxygen saturation is 93% on room air.  LUNGS:  Clear with equal breath sounds bilaterally.  CARDIAC:  Regular rate and rhythm.  Normal S1 and S2.  CHEST:  Sternum is stable, but there is a very faint click on the left  likely from the costal cartilage.  EXTREMITIES:  Her leg incision is healing well.  She has no peripheral  edema.   Chest x-ray shows good aeration of the lungs bilaterally.  There is no  significant effusions or infiltrates.  However, it does appear that her  uppermost sternal wire may have moved, it is unclear if this may just be  due to different angulation which is definitely present on the chest x-  ray.   IMPRESSION:  Overall, the patient is doing well.  I failed to mention in  her HPI, she complained of having some difficulty concentrating and she  describes as being foggy headed at that time.  She is normal, but other  times she feels like she is distracted, not able to  concentrate for long  period of time.  I encouraged her that this is a very common and almost  universal in the early weeks after the surgery.  She should notice  significant improvement over this over the next 3-4 weeks as well as  improvements in her stamina.  She was advised if she has any difficulty  with her incisions, she should call us immediately or if she notices any  major clicking or popping in her sternum, she should also give [place] a  call.  I advised that she may begin driving.  She does not feel she is  ready yet and she is going to wait a few more weeks.  She is still not  to lift the objects greater than 10 pounds for at least another 4 weeks.  She is going to continue follow up with Dr. Sherril Croon and Dr. Andee Lineman.  I  would be happy to see her back anytime if I can be of any further  assistance with her care.  Thank you very much.   Salvatore Decent Dorris Fetch, M.D.  Electronically Signed   SCH/MEDQ  D:  05/26/2008  T:  05/27/2008  Job:  [id number]   cc:  Learta Codding, MD,FACC  Doreen Beam, MD

## 2011-05-20 LAB — GLUCOSE, CAPILLARY

## 2011-05-22 LAB — CBC
HCT: 28.4 — ABNORMAL LOW
HCT: 29.1 — ABNORMAL LOW
HCT: 33.5 — ABNORMAL LOW
HCT: 35.5 — ABNORMAL LOW
HCT: 36.6
HCT: 37
HCT: 37.4
HCT: 38.4
HCT: 40
Hemoglobin: 11.1 — ABNORMAL LOW
Hemoglobin: 12.7
Hemoglobin: 9.6 — ABNORMAL LOW
MCHC: 32.9
MCHC: 33.3
MCHC: 33.4
MCHC: 33.5
MCHC: 34
MCV: 91.6
MCV: 91.7
MCV: 92.8
MCV: 92.8
MCV: 93
MCV: 93.1
MCV: 93.7
MCV: 94.2
Platelets: 134 — ABNORMAL LOW
Platelets: 151
Platelets: 169
Platelets: 186
Platelets: 359
Platelets: 370
Platelets: 415 — ABNORMAL HIGH
RBC: 2.94 — ABNORMAL LOW
RBC: 3.03 — ABNORMAL LOW
RBC: 3.09 — ABNORMAL LOW
RBC: 3.62 — ABNORMAL LOW
RBC: 3.81 — ABNORMAL LOW
RBC: 3.94
RDW: 13.9
RDW: 14
RDW: 14.1
RDW: 14.2
RDW: 14.2
RDW: 14.3
RDW: 14.5
WBC: 11.7 — ABNORMAL HIGH
WBC: 13.8 — ABNORMAL HIGH
WBC: 14.5 — ABNORMAL HIGH
WBC: 15.9 — ABNORMAL HIGH
WBC: 17.6 — ABNORMAL HIGH
WBC: 18.8 — ABNORMAL HIGH
WBC: 21.8 — ABNORMAL HIGH
WBC: 8.8

## 2011-05-22 LAB — BASIC METABOLIC PANEL
BUN: 22
BUN: 25 — ABNORMAL HIGH
BUN: 25 — ABNORMAL HIGH
BUN: 6
BUN: 8
BUN: 8
CO2: 25
CO2: 27
CO2: 29
CO2: 30
Calcium: 7.6 — ABNORMAL LOW
Calcium: 8 — ABNORMAL LOW
Chloride: 102
Chloride: 104
Chloride: 104
Chloride: 98
Chloride: 99
Creatinine, Ser: 0.64
Creatinine, Ser: 0.69
Creatinine, Ser: 0.7
Creatinine, Ser: 0.93
Creatinine, Ser: 1.02
GFR calc Af Amer: >60
GFR calc Af Amer: >60
GFR calc Af Amer: >60
GFR calc non Af Amer: 54 — ABNORMAL LOW
GFR calc non Af Amer: >60
Glucose, Bld: 107 — ABNORMAL HIGH
Glucose, Bld: 109 — ABNORMAL HIGH
Glucose, Bld: 162 — ABNORMAL HIGH
Glucose, Bld: 201 — ABNORMAL HIGH
Potassium: 4.2
Potassium: 4.2
Potassium: 4.2
Potassium: 4.3
Sodium: 138

## 2011-05-22 LAB — DIFFERENTIAL
Basophils Absolute: 0
Eosinophils Absolute: 0.3
Eosinophils Relative: 4
Lymphocytes Relative: 29

## 2011-05-22 LAB — GLUCOSE, CAPILLARY
Glucose-Capillary: 111 — ABNORMAL HIGH
Glucose-Capillary: 125 — ABNORMAL HIGH
Glucose-Capillary: 134 — ABNORMAL HIGH
Glucose-Capillary: 138 — ABNORMAL HIGH
Glucose-Capillary: 147 — ABNORMAL HIGH
Glucose-Capillary: 148 — ABNORMAL HIGH
Glucose-Capillary: 153 — ABNORMAL HIGH
Glucose-Capillary: 162 — ABNORMAL HIGH
Glucose-Capillary: 173 — ABNORMAL HIGH
Glucose-Capillary: 178 — ABNORMAL HIGH
Glucose-Capillary: 186 — ABNORMAL HIGH
Glucose-Capillary: 211 — ABNORMAL HIGH
Glucose-Capillary: 216 — ABNORMAL HIGH
Glucose-Capillary: 220 — ABNORMAL HIGH
Glucose-Capillary: 231 — ABNORMAL HIGH
Glucose-Capillary: 81
Glucose-Capillary: 85
Glucose-Capillary: 99

## 2011-05-22 LAB — POCT I-STAT, CHEM 8
BUN: 9
Chloride: 100
Potassium: 3.9
Sodium: 138

## 2011-05-22 LAB — CARDIAC PANEL(CRET KIN+CKTOT+MB+TROPI)
CK, MB: 1.6
CK, MB: 1.8
Relative Index: INVALID
Relative Index: INVALID
Relative Index: INVALID
Relative Index: INVALID
Total CK: 16
Total CK: 17
Total CK: 20
Total CK: 33
Total CK: 37
Total CK: 40
Troponin I: 0.06
Troponin I: 0.42 — ABNORMAL HIGH

## 2011-05-22 LAB — URINALYSIS, ROUTINE W REFLEX MICROSCOPIC
Bilirubin Urine: NEGATIVE
Glucose, UA: NEGATIVE
Nitrite: NEGATIVE
Specific Gravity, Urine: 1.011
pH: 6

## 2011-05-22 LAB — POCT I-STAT 3, ART BLOOD GAS (G3+)
Acid-Base Excess: 2
Bicarbonate: 23.7
Bicarbonate: 27.5 — ABNORMAL HIGH
Bicarbonate: 31.3 — ABNORMAL HIGH
O2 Saturation: 92
TCO2: 25
TCO2: 27
TCO2: 29
TCO2: 33
pCO2 arterial: 31.5 — ABNORMAL LOW
pCO2 arterial: 39.9
pCO2 arterial: 40.9
pCO2 arterial: 46.6 — ABNORMAL HIGH
pCO2 arterial: 50.1 — ABNORMAL HIGH
pH, Arterial: 7.37
pH, Arterial: 7.38
pH, Arterial: 7.403 — ABNORMAL HIGH
pH, Arterial: 7.425 — ABNORMAL HIGH
pO2, Arterial: 250 — ABNORMAL HIGH
pO2, Arterial: 81

## 2011-05-22 LAB — HEPARIN LEVEL (UNFRACTIONATED)
Heparin Unfractionated: 0.26 — ABNORMAL LOW
Heparin Unfractionated: 0.32
Heparin Unfractionated: 0.35
Heparin Unfractionated: 0.38
Heparin Unfractionated: 0.6

## 2011-05-22 LAB — POCT I-STAT 4, (NA,K, GLUC, HGB,HCT)
Glucose, Bld: 156 — ABNORMAL HIGH
Glucose, Bld: 197 — ABNORMAL HIGH
Glucose, Bld: 231 — ABNORMAL HIGH
HCT: 23 — ABNORMAL LOW
HCT: 26 — ABNORMAL LOW
HCT: 32 — ABNORMAL LOW
HCT: 33 — ABNORMAL LOW
HCT: 34 — ABNORMAL LOW
Hemoglobin: 10.9 — ABNORMAL LOW
Hemoglobin: 11.6 — ABNORMAL LOW
Hemoglobin: 7.5 — CL
Potassium: 3.4 — ABNORMAL LOW
Potassium: 3.5
Potassium: 3.9
Potassium: 4.9
Sodium: 135
Sodium: 136
Sodium: 140

## 2011-05-22 LAB — COMPREHENSIVE METABOLIC PANEL
BUN: 20
CO2: 29
Calcium: 9.3
Creatinine, Ser: 0.94
GFR calc non Af Amer: 59 — ABNORMAL LOW
Glucose, Bld: 195 — ABNORMAL HIGH
Sodium: 138
Total Protein: 6.7

## 2011-05-22 LAB — TYPE AND SCREEN

## 2011-05-22 LAB — POCT I-STAT 3, VENOUS BLOOD GAS (G3P V)
Acid-Base Excess: 7 — ABNORMAL HIGH
Bicarbonate: 33.2 — ABNORMAL HIGH
TCO2: 35
pH, Ven: 7.39 — ABNORMAL HIGH

## 2011-05-22 LAB — BLOOD GAS, ARTERIAL
FIO2: 0.36
TCO2: 29.1
pCO2 arterial: 38.7
pH, Arterial: 7.471 — ABNORMAL HIGH

## 2011-05-22 LAB — B-NATRIURETIC PEPTIDE (CONVERTED LAB)
Pro B Natriuretic peptide (BNP): 188 — ABNORMAL HIGH
Pro B Natriuretic peptide (BNP): 348 — ABNORMAL HIGH
Pro B Natriuretic peptide (BNP): 389 — ABNORMAL HIGH

## 2011-05-22 LAB — URINE CULTURE: Special Requests: NEGATIVE

## 2011-05-22 LAB — LIPID PANEL: Cholesterol: 201 — ABNORMAL HIGH

## 2011-05-22 LAB — PLATELET COUNT: Platelets: 234

## 2011-05-22 LAB — ABO/RH: ABO/RH(D): O NEG

## 2011-05-22 LAB — MAGNESIUM
Magnesium: 1.9
Magnesium: 2
Magnesium: 2.6 — ABNORMAL HIGH

## 2011-05-22 LAB — CREATININE, SERUM
GFR calc non Af Amer: >60
GFR calc non Af Amer: >60

## 2011-05-22 LAB — HEMOGLOBIN A1C: Hgb A1c MFr Bld: 7.2 — ABNORMAL HIGH

## 2011-05-22 LAB — URINE MICROSCOPIC-ADD ON

## 2011-10-15 DIAGNOSIS — I10 Essential (primary) hypertension: Secondary | ICD-10-CM | POA: Diagnosis not present

## 2011-10-15 DIAGNOSIS — J329 Chronic sinusitis, unspecified: Secondary | ICD-10-CM | POA: Diagnosis not present

## 2011-10-17 DIAGNOSIS — I10 Essential (primary) hypertension: Secondary | ICD-10-CM | POA: Diagnosis not present

## 2011-10-28 DIAGNOSIS — I10 Essential (primary) hypertension: Secondary | ICD-10-CM | POA: Diagnosis not present

## 2011-10-28 DIAGNOSIS — E119 Type 2 diabetes mellitus without complications: Secondary | ICD-10-CM | POA: Diagnosis not present

## 2012-02-04 DIAGNOSIS — E119 Type 2 diabetes mellitus without complications: Secondary | ICD-10-CM | POA: Diagnosis not present

## 2012-02-28 DIAGNOSIS — M1710 Unilateral primary osteoarthritis, unspecified knee: Secondary | ICD-10-CM | POA: Diagnosis not present

## 2012-03-24 DIAGNOSIS — K589 Irritable bowel syndrome without diarrhea: Secondary | ICD-10-CM | POA: Diagnosis not present

## 2012-05-08 DIAGNOSIS — K589 Irritable bowel syndrome without diarrhea: Secondary | ICD-10-CM | POA: Diagnosis not present

## 2012-05-28 DIAGNOSIS — Z23 Encounter for immunization: Secondary | ICD-10-CM | POA: Diagnosis not present

## 2012-06-01 DIAGNOSIS — Z0000 Encounter for general adult medical examination without abnormal findings: Secondary | ICD-10-CM | POA: Diagnosis not present

## 2012-06-01 DIAGNOSIS — I10 Essential (primary) hypertension: Secondary | ICD-10-CM | POA: Diagnosis not present

## 2012-06-01 DIAGNOSIS — R5383 Other fatigue: Secondary | ICD-10-CM | POA: Diagnosis not present

## 2012-06-01 DIAGNOSIS — Z79899 Other long term (current) drug therapy: Secondary | ICD-10-CM | POA: Diagnosis not present

## 2012-06-15 DIAGNOSIS — R197 Diarrhea, unspecified: Secondary | ICD-10-CM | POA: Diagnosis not present

## 2012-06-22 DIAGNOSIS — K648 Other hemorrhoids: Secondary | ICD-10-CM | POA: Diagnosis not present

## 2012-06-22 DIAGNOSIS — E119 Type 2 diabetes mellitus without complications: Secondary | ICD-10-CM | POA: Diagnosis not present

## 2012-06-22 DIAGNOSIS — I2510 Atherosclerotic heart disease of native coronary artery without angina pectoris: Secondary | ICD-10-CM | POA: Diagnosis not present

## 2012-06-22 DIAGNOSIS — Z808 Family history of malignant neoplasm of other organs or systems: Secondary | ICD-10-CM | POA: Diagnosis not present

## 2012-06-22 DIAGNOSIS — K219 Gastro-esophageal reflux disease without esophagitis: Secondary | ICD-10-CM | POA: Diagnosis not present

## 2012-06-22 DIAGNOSIS — I10 Essential (primary) hypertension: Secondary | ICD-10-CM | POA: Diagnosis not present

## 2012-06-22 DIAGNOSIS — Z951 Presence of aortocoronary bypass graft: Secondary | ICD-10-CM | POA: Diagnosis not present

## 2012-06-22 DIAGNOSIS — I428 Other cardiomyopathies: Secondary | ICD-10-CM | POA: Diagnosis not present

## 2012-06-22 DIAGNOSIS — K5730 Diverticulosis of large intestine without perforation or abscess without bleeding: Secondary | ICD-10-CM | POA: Diagnosis not present

## 2012-06-22 DIAGNOSIS — R197 Diarrhea, unspecified: Secondary | ICD-10-CM | POA: Diagnosis not present

## 2012-06-22 DIAGNOSIS — D126 Benign neoplasm of colon, unspecified: Secondary | ICD-10-CM | POA: Diagnosis not present

## 2012-07-09 DIAGNOSIS — R197 Diarrhea, unspecified: Secondary | ICD-10-CM | POA: Diagnosis not present

## 2012-07-17 DIAGNOSIS — R197 Diarrhea, unspecified: Secondary | ICD-10-CM | POA: Diagnosis not present

## 2012-07-24 DIAGNOSIS — R197 Diarrhea, unspecified: Secondary | ICD-10-CM | POA: Diagnosis not present

## 2012-09-03 DIAGNOSIS — K589 Irritable bowel syndrome without diarrhea: Secondary | ICD-10-CM | POA: Diagnosis not present

## 2012-11-01 DIAGNOSIS — Z79899 Other long term (current) drug therapy: Secondary | ICD-10-CM | POA: Diagnosis not present

## 2012-11-01 DIAGNOSIS — M1710 Unilateral primary osteoarthritis, unspecified knee: Secondary | ICD-10-CM | POA: Diagnosis not present

## 2012-11-01 DIAGNOSIS — M1120 Other chondrocalcinosis, unspecified site: Secondary | ICD-10-CM | POA: Diagnosis not present

## 2012-11-01 DIAGNOSIS — I10 Essential (primary) hypertension: Secondary | ICD-10-CM | POA: Diagnosis not present

## 2012-11-01 DIAGNOSIS — M009 Pyogenic arthritis, unspecified: Secondary | ICD-10-CM | POA: Diagnosis not present

## 2012-11-01 DIAGNOSIS — Z951 Presence of aortocoronary bypass graft: Secondary | ICD-10-CM | POA: Diagnosis not present

## 2012-11-02 DIAGNOSIS — M1710 Unilateral primary osteoarthritis, unspecified knee: Secondary | ICD-10-CM | POA: Diagnosis not present

## 2012-11-11 DIAGNOSIS — M1710 Unilateral primary osteoarthritis, unspecified knee: Secondary | ICD-10-CM | POA: Diagnosis not present

## 2012-11-11 DIAGNOSIS — I10 Essential (primary) hypertension: Secondary | ICD-10-CM | POA: Diagnosis not present

## 2013-02-02 DIAGNOSIS — I10 Essential (primary) hypertension: Secondary | ICD-10-CM | POA: Diagnosis not present

## 2013-02-02 DIAGNOSIS — R197 Diarrhea, unspecified: Secondary | ICD-10-CM | POA: Diagnosis not present

## 2013-02-02 DIAGNOSIS — E119 Type 2 diabetes mellitus without complications: Secondary | ICD-10-CM | POA: Diagnosis not present

## 2013-02-02 DIAGNOSIS — E782 Mixed hyperlipidemia: Secondary | ICD-10-CM | POA: Diagnosis not present

## 2013-05-01 DIAGNOSIS — I509 Heart failure, unspecified: Secondary | ICD-10-CM | POA: Diagnosis not present

## 2013-05-01 DIAGNOSIS — Z79899 Other long term (current) drug therapy: Secondary | ICD-10-CM | POA: Diagnosis not present

## 2013-05-01 DIAGNOSIS — E119 Type 2 diabetes mellitus without complications: Secondary | ICD-10-CM | POA: Diagnosis not present

## 2013-05-01 DIAGNOSIS — M1710 Unilateral primary osteoarthritis, unspecified knee: Secondary | ICD-10-CM | POA: Diagnosis not present

## 2013-05-01 DIAGNOSIS — M25469 Effusion, unspecified knee: Secondary | ICD-10-CM | POA: Diagnosis not present

## 2013-05-01 DIAGNOSIS — N390 Urinary tract infection, site not specified: Secondary | ICD-10-CM | POA: Diagnosis not present

## 2013-05-01 DIAGNOSIS — I10 Essential (primary) hypertension: Secondary | ICD-10-CM | POA: Diagnosis not present

## 2013-05-13 DIAGNOSIS — F0390 Unspecified dementia without behavioral disturbance: Secondary | ICD-10-CM | POA: Diagnosis not present

## 2013-05-18 DIAGNOSIS — R413 Other amnesia: Secondary | ICD-10-CM | POA: Diagnosis not present

## 2013-05-26 DIAGNOSIS — Z23 Encounter for immunization: Secondary | ICD-10-CM | POA: Diagnosis not present

## 2013-08-18 DIAGNOSIS — Z0000 Encounter for general adult medical examination without abnormal findings: Secondary | ICD-10-CM | POA: Diagnosis not present

## 2013-08-18 DIAGNOSIS — E782 Mixed hyperlipidemia: Secondary | ICD-10-CM | POA: Diagnosis not present

## 2013-08-18 DIAGNOSIS — I10 Essential (primary) hypertension: Secondary | ICD-10-CM | POA: Diagnosis not present

## 2013-12-02 DIAGNOSIS — K589 Irritable bowel syndrome without diarrhea: Secondary | ICD-10-CM | POA: Diagnosis not present

## 2013-12-02 DIAGNOSIS — I10 Essential (primary) hypertension: Secondary | ICD-10-CM | POA: Diagnosis not present

## 2014-03-03 DIAGNOSIS — M549 Dorsalgia, unspecified: Secondary | ICD-10-CM | POA: Diagnosis not present

## 2014-04-12 DIAGNOSIS — E119 Type 2 diabetes mellitus without complications: Secondary | ICD-10-CM | POA: Diagnosis not present

## 2014-04-12 DIAGNOSIS — M25559 Pain in unspecified hip: Secondary | ICD-10-CM | POA: Diagnosis not present

## 2014-04-12 DIAGNOSIS — R6889 Other general symptoms and signs: Secondary | ICD-10-CM | POA: Diagnosis not present

## 2014-04-12 DIAGNOSIS — Z96649 Presence of unspecified artificial hip joint: Secondary | ICD-10-CM | POA: Diagnosis not present

## 2014-04-12 DIAGNOSIS — I10 Essential (primary) hypertension: Secondary | ICD-10-CM | POA: Diagnosis not present

## 2014-04-12 DIAGNOSIS — W19XXXA Unspecified fall, initial encounter: Secondary | ICD-10-CM | POA: Diagnosis not present

## 2014-04-12 DIAGNOSIS — N390 Urinary tract infection, site not specified: Secondary | ICD-10-CM | POA: Diagnosis not present

## 2014-04-12 DIAGNOSIS — IMO0002 Reserved for concepts with insufficient information to code with codable children: Secondary | ICD-10-CM | POA: Diagnosis not present

## 2014-04-12 DIAGNOSIS — S298XXA Other specified injuries of thorax, initial encounter: Secondary | ICD-10-CM | POA: Diagnosis not present

## 2014-04-12 DIAGNOSIS — I25810 Atherosclerosis of coronary artery bypass graft(s) without angina pectoris: Secondary | ICD-10-CM | POA: Diagnosis not present

## 2014-04-12 DIAGNOSIS — S79919A Unspecified injury of unspecified hip, initial encounter: Secondary | ICD-10-CM | POA: Diagnosis not present

## 2014-04-12 DIAGNOSIS — Y939 Activity, unspecified: Secondary | ICD-10-CM | POA: Diagnosis not present

## 2014-04-12 DIAGNOSIS — Z8781 Personal history of (healed) traumatic fracture: Secondary | ICD-10-CM | POA: Diagnosis not present

## 2014-04-12 DIAGNOSIS — J45909 Unspecified asthma, uncomplicated: Secondary | ICD-10-CM | POA: Diagnosis not present

## 2014-05-30 DIAGNOSIS — Z23 Encounter for immunization: Secondary | ICD-10-CM | POA: Diagnosis not present

## 2014-06-02 DIAGNOSIS — E119 Type 2 diabetes mellitus without complications: Secondary | ICD-10-CM | POA: Diagnosis not present

## 2014-06-02 DIAGNOSIS — R5381 Other malaise: Secondary | ICD-10-CM | POA: Diagnosis not present

## 2014-06-02 DIAGNOSIS — R5383 Other fatigue: Secondary | ICD-10-CM | POA: Diagnosis not present

## 2014-06-02 DIAGNOSIS — E559 Vitamin D deficiency, unspecified: Secondary | ICD-10-CM | POA: Diagnosis not present

## 2014-06-02 DIAGNOSIS — I10 Essential (primary) hypertension: Secondary | ICD-10-CM | POA: Diagnosis not present

## 2014-06-02 DIAGNOSIS — F0390 Unspecified dementia without behavioral disturbance: Secondary | ICD-10-CM | POA: Diagnosis not present

## 2014-06-03 DIAGNOSIS — G629 Polyneuropathy, unspecified: Secondary | ICD-10-CM | POA: Diagnosis not present

## 2014-06-03 DIAGNOSIS — E876 Hypokalemia: Secondary | ICD-10-CM | POA: Diagnosis not present

## 2014-06-03 DIAGNOSIS — R40241 Glasgow coma scale score 13-15: Secondary | ICD-10-CM | POA: Diagnosis not present

## 2014-06-03 DIAGNOSIS — Z79899 Other long term (current) drug therapy: Secondary | ICD-10-CM | POA: Diagnosis not present

## 2014-06-03 DIAGNOSIS — J45909 Unspecified asthma, uncomplicated: Secondary | ICD-10-CM | POA: Diagnosis not present

## 2014-06-03 DIAGNOSIS — J310 Chronic rhinitis: Secondary | ICD-10-CM | POA: Diagnosis not present

## 2014-06-03 DIAGNOSIS — K589 Irritable bowel syndrome without diarrhea: Secondary | ICD-10-CM | POA: Diagnosis not present

## 2014-06-03 DIAGNOSIS — I10 Essential (primary) hypertension: Secondary | ICD-10-CM | POA: Diagnosis not present

## 2014-06-03 DIAGNOSIS — E11649 Type 2 diabetes mellitus with hypoglycemia without coma: Secondary | ICD-10-CM | POA: Diagnosis not present

## 2014-06-03 DIAGNOSIS — R4182 Altered mental status, unspecified: Secondary | ICD-10-CM | POA: Diagnosis not present

## 2014-06-03 DIAGNOSIS — E162 Hypoglycemia, unspecified: Secondary | ICD-10-CM | POA: Diagnosis not present

## 2014-06-03 DIAGNOSIS — E785 Hyperlipidemia, unspecified: Secondary | ICD-10-CM | POA: Diagnosis not present

## 2014-06-03 DIAGNOSIS — J811 Chronic pulmonary edema: Secondary | ICD-10-CM | POA: Diagnosis not present

## 2014-06-03 DIAGNOSIS — I509 Heart failure, unspecified: Secondary | ICD-10-CM | POA: Diagnosis not present

## 2014-06-04 DIAGNOSIS — E162 Hypoglycemia, unspecified: Secondary | ICD-10-CM | POA: Diagnosis not present

## 2014-06-04 DIAGNOSIS — E876 Hypokalemia: Secondary | ICD-10-CM | POA: Diagnosis not present

## 2014-06-04 DIAGNOSIS — I509 Heart failure, unspecified: Secondary | ICD-10-CM | POA: Diagnosis not present

## 2014-06-04 DIAGNOSIS — K589 Irritable bowel syndrome without diarrhea: Secondary | ICD-10-CM | POA: Diagnosis not present

## 2014-06-04 DIAGNOSIS — E11649 Type 2 diabetes mellitus with hypoglycemia without coma: Secondary | ICD-10-CM | POA: Diagnosis not present

## 2014-06-07 DIAGNOSIS — R4781 Slurred speech: Secondary | ICD-10-CM | POA: Diagnosis not present

## 2014-06-07 DIAGNOSIS — R413 Other amnesia: Secondary | ICD-10-CM | POA: Diagnosis not present

## 2014-06-16 DIAGNOSIS — E162 Hypoglycemia, unspecified: Secondary | ICD-10-CM | POA: Diagnosis not present

## 2014-09-15 DIAGNOSIS — E119 Type 2 diabetes mellitus without complications: Secondary | ICD-10-CM | POA: Diagnosis not present

## 2014-09-15 DIAGNOSIS — E162 Hypoglycemia, unspecified: Secondary | ICD-10-CM | POA: Diagnosis not present

## 2014-09-15 DIAGNOSIS — F0390 Unspecified dementia without behavioral disturbance: Secondary | ICD-10-CM | POA: Diagnosis not present

## 2014-09-15 DIAGNOSIS — Z1389 Encounter for screening for other disorder: Secondary | ICD-10-CM | POA: Diagnosis not present

## 2014-09-15 DIAGNOSIS — Z0000 Encounter for general adult medical examination without abnormal findings: Secondary | ICD-10-CM | POA: Diagnosis not present

## 2014-09-15 DIAGNOSIS — I10 Essential (primary) hypertension: Secondary | ICD-10-CM | POA: Diagnosis not present

## 2014-12-15 DIAGNOSIS — I10 Essential (primary) hypertension: Secondary | ICD-10-CM | POA: Diagnosis not present

## 2014-12-15 DIAGNOSIS — F0390 Unspecified dementia without behavioral disturbance: Secondary | ICD-10-CM | POA: Diagnosis not present

## 2014-12-15 DIAGNOSIS — E119 Type 2 diabetes mellitus without complications: Secondary | ICD-10-CM | POA: Diagnosis not present

## 2015-03-16 DIAGNOSIS — I10 Essential (primary) hypertension: Secondary | ICD-10-CM | POA: Diagnosis not present

## 2015-03-16 DIAGNOSIS — E119 Type 2 diabetes mellitus without complications: Secondary | ICD-10-CM | POA: Diagnosis not present

## 2015-03-16 DIAGNOSIS — F0390 Unspecified dementia without behavioral disturbance: Secondary | ICD-10-CM | POA: Diagnosis not present

## 2015-05-03 DIAGNOSIS — Z23 Encounter for immunization: Secondary | ICD-10-CM | POA: Diagnosis not present

## 2015-06-16 DIAGNOSIS — I10 Essential (primary) hypertension: Secondary | ICD-10-CM | POA: Diagnosis not present

## 2015-06-16 DIAGNOSIS — E119 Type 2 diabetes mellitus without complications: Secondary | ICD-10-CM | POA: Diagnosis not present

## 2015-09-18 DIAGNOSIS — I10 Essential (primary) hypertension: Secondary | ICD-10-CM | POA: Diagnosis not present

## 2015-09-18 DIAGNOSIS — Z0000 Encounter for general adult medical examination without abnormal findings: Secondary | ICD-10-CM | POA: Diagnosis not present

## 2015-09-18 DIAGNOSIS — Z1389 Encounter for screening for other disorder: Secondary | ICD-10-CM | POA: Diagnosis not present

## 2015-09-18 DIAGNOSIS — K589 Irritable bowel syndrome without diarrhea: Secondary | ICD-10-CM | POA: Diagnosis not present

## 2015-09-18 DIAGNOSIS — E1165 Type 2 diabetes mellitus with hyperglycemia: Secondary | ICD-10-CM | POA: Diagnosis not present

## 2015-10-16 DIAGNOSIS — Z7984 Long term (current) use of oral hypoglycemic drugs: Secondary | ICD-10-CM | POA: Diagnosis not present

## 2015-10-16 DIAGNOSIS — I509 Heart failure, unspecified: Secondary | ICD-10-CM | POA: Diagnosis not present

## 2015-10-16 DIAGNOSIS — E28319 Asymptomatic premature menopause: Secondary | ICD-10-CM | POA: Diagnosis not present

## 2015-10-16 DIAGNOSIS — Z7982 Long term (current) use of aspirin: Secondary | ICD-10-CM | POA: Diagnosis not present

## 2015-10-16 DIAGNOSIS — G629 Polyneuropathy, unspecified: Secondary | ICD-10-CM | POA: Diagnosis not present

## 2015-10-16 DIAGNOSIS — E119 Type 2 diabetes mellitus without complications: Secondary | ICD-10-CM | POA: Diagnosis not present

## 2015-10-16 DIAGNOSIS — E876 Hypokalemia: Secondary | ICD-10-CM | POA: Diagnosis not present

## 2015-10-16 DIAGNOSIS — Z90710 Acquired absence of both cervix and uterus: Secondary | ICD-10-CM | POA: Diagnosis not present

## 2015-10-16 DIAGNOSIS — M810 Age-related osteoporosis without current pathological fracture: Secondary | ICD-10-CM | POA: Diagnosis not present

## 2015-10-16 DIAGNOSIS — Z79899 Other long term (current) drug therapy: Secondary | ICD-10-CM | POA: Diagnosis not present

## 2015-10-16 DIAGNOSIS — I10 Essential (primary) hypertension: Secondary | ICD-10-CM | POA: Diagnosis not present

## 2015-12-19 DIAGNOSIS — F0280 Dementia in other diseases classified elsewhere without behavioral disturbance: Secondary | ICD-10-CM | POA: Diagnosis not present

## 2015-12-19 DIAGNOSIS — I10 Essential (primary) hypertension: Secondary | ICD-10-CM | POA: Diagnosis not present

## 2015-12-19 DIAGNOSIS — E1165 Type 2 diabetes mellitus with hyperglycemia: Secondary | ICD-10-CM | POA: Diagnosis not present

## 2016-02-01 DIAGNOSIS — F0280 Dementia in other diseases classified elsewhere without behavioral disturbance: Secondary | ICD-10-CM | POA: Diagnosis not present

## 2016-02-01 DIAGNOSIS — E1165 Type 2 diabetes mellitus with hyperglycemia: Secondary | ICD-10-CM | POA: Diagnosis not present

## 2016-02-01 DIAGNOSIS — I10 Essential (primary) hypertension: Secondary | ICD-10-CM | POA: Diagnosis not present

## 2016-02-01 DIAGNOSIS — E784 Other hyperlipidemia: Secondary | ICD-10-CM | POA: Diagnosis not present

## 2016-03-01 DIAGNOSIS — E1165 Type 2 diabetes mellitus with hyperglycemia: Secondary | ICD-10-CM | POA: Diagnosis not present

## 2016-03-01 DIAGNOSIS — I10 Essential (primary) hypertension: Secondary | ICD-10-CM | POA: Diagnosis not present

## 2016-03-01 DIAGNOSIS — F0280 Dementia in other diseases classified elsewhere without behavioral disturbance: Secondary | ICD-10-CM | POA: Diagnosis not present

## 2016-03-01 DIAGNOSIS — E784 Other hyperlipidemia: Secondary | ICD-10-CM | POA: Diagnosis not present

## 2016-03-21 DIAGNOSIS — F0280 Dementia in other diseases classified elsewhere without behavioral disturbance: Secondary | ICD-10-CM | POA: Diagnosis not present

## 2016-03-21 DIAGNOSIS — I10 Essential (primary) hypertension: Secondary | ICD-10-CM | POA: Diagnosis not present

## 2016-03-21 DIAGNOSIS — E1165 Type 2 diabetes mellitus with hyperglycemia: Secondary | ICD-10-CM | POA: Diagnosis not present

## 2016-03-22 DIAGNOSIS — E784 Other hyperlipidemia: Secondary | ICD-10-CM | POA: Diagnosis not present

## 2016-03-22 DIAGNOSIS — F0280 Dementia in other diseases classified elsewhere without behavioral disturbance: Secondary | ICD-10-CM | POA: Diagnosis not present

## 2016-03-22 DIAGNOSIS — E1165 Type 2 diabetes mellitus with hyperglycemia: Secondary | ICD-10-CM | POA: Diagnosis not present

## 2016-03-22 DIAGNOSIS — I10 Essential (primary) hypertension: Secondary | ICD-10-CM | POA: Diagnosis not present

## 2016-04-19 DIAGNOSIS — E784 Other hyperlipidemia: Secondary | ICD-10-CM | POA: Diagnosis not present

## 2016-04-19 DIAGNOSIS — F0280 Dementia in other diseases classified elsewhere without behavioral disturbance: Secondary | ICD-10-CM | POA: Diagnosis not present

## 2016-04-19 DIAGNOSIS — E1165 Type 2 diabetes mellitus with hyperglycemia: Secondary | ICD-10-CM | POA: Diagnosis not present

## 2016-04-19 DIAGNOSIS — I10 Essential (primary) hypertension: Secondary | ICD-10-CM | POA: Diagnosis not present

## 2016-05-21 DIAGNOSIS — R197 Diarrhea, unspecified: Secondary | ICD-10-CM | POA: Diagnosis not present

## 2016-05-21 DIAGNOSIS — M810 Age-related osteoporosis without current pathological fracture: Secondary | ICD-10-CM | POA: Diagnosis not present

## 2016-05-21 DIAGNOSIS — F0280 Dementia in other diseases classified elsewhere without behavioral disturbance: Secondary | ICD-10-CM | POA: Diagnosis not present

## 2016-05-21 DIAGNOSIS — E1165 Type 2 diabetes mellitus with hyperglycemia: Secondary | ICD-10-CM | POA: Diagnosis not present

## 2016-05-21 DIAGNOSIS — I10 Essential (primary) hypertension: Secondary | ICD-10-CM | POA: Diagnosis not present

## 2016-05-21 DIAGNOSIS — K2900 Acute gastritis without bleeding: Secondary | ICD-10-CM | POA: Diagnosis not present

## 2016-05-29 DIAGNOSIS — N6322 Unspecified lump in the left breast, upper inner quadrant: Secondary | ICD-10-CM | POA: Diagnosis not present

## 2016-05-29 DIAGNOSIS — N644 Mastodynia: Secondary | ICD-10-CM | POA: Diagnosis not present

## 2016-06-10 DIAGNOSIS — I10 Essential (primary) hypertension: Secondary | ICD-10-CM | POA: Diagnosis not present

## 2016-06-10 DIAGNOSIS — E784 Other hyperlipidemia: Secondary | ICD-10-CM | POA: Diagnosis not present

## 2016-06-10 DIAGNOSIS — I2584 Coronary atherosclerosis due to calcified coronary lesion: Secondary | ICD-10-CM | POA: Diagnosis not present

## 2016-06-10 DIAGNOSIS — M545 Low back pain: Secondary | ICD-10-CM | POA: Diagnosis not present

## 2016-06-14 DIAGNOSIS — Z23 Encounter for immunization: Secondary | ICD-10-CM | POA: Diagnosis not present

## 2016-07-15 DIAGNOSIS — H40033 Anatomical narrow angle, bilateral: Secondary | ICD-10-CM | POA: Diagnosis not present

## 2016-07-15 DIAGNOSIS — H2513 Age-related nuclear cataract, bilateral: Secondary | ICD-10-CM | POA: Diagnosis not present

## 2016-07-31 DIAGNOSIS — M545 Low back pain: Secondary | ICD-10-CM | POA: Diagnosis not present

## 2016-07-31 DIAGNOSIS — I2584 Coronary atherosclerosis due to calcified coronary lesion: Secondary | ICD-10-CM | POA: Diagnosis not present

## 2016-07-31 DIAGNOSIS — I10 Essential (primary) hypertension: Secondary | ICD-10-CM | POA: Diagnosis not present

## 2016-07-31 DIAGNOSIS — E784 Other hyperlipidemia: Secondary | ICD-10-CM | POA: Diagnosis not present

## 2016-08-23 DIAGNOSIS — I10 Essential (primary) hypertension: Secondary | ICD-10-CM | POA: Diagnosis not present

## 2016-08-23 DIAGNOSIS — M810 Age-related osteoporosis without current pathological fracture: Secondary | ICD-10-CM | POA: Diagnosis not present

## 2016-08-23 DIAGNOSIS — F0280 Dementia in other diseases classified elsewhere without behavioral disturbance: Secondary | ICD-10-CM | POA: Diagnosis not present

## 2016-08-23 DIAGNOSIS — E1165 Type 2 diabetes mellitus with hyperglycemia: Secondary | ICD-10-CM | POA: Diagnosis not present

## 2016-09-05 DIAGNOSIS — I10 Essential (primary) hypertension: Secondary | ICD-10-CM | POA: Diagnosis not present

## 2016-09-05 DIAGNOSIS — M810 Age-related osteoporosis without current pathological fracture: Secondary | ICD-10-CM | POA: Diagnosis not present

## 2016-09-05 DIAGNOSIS — F0280 Dementia in other diseases classified elsewhere without behavioral disturbance: Secondary | ICD-10-CM | POA: Diagnosis not present

## 2016-09-05 DIAGNOSIS — E1165 Type 2 diabetes mellitus with hyperglycemia: Secondary | ICD-10-CM | POA: Diagnosis not present

## 2017-01-16 DIAGNOSIS — F0280 Dementia in other diseases classified elsewhere without behavioral disturbance: Secondary | ICD-10-CM | POA: Diagnosis not present

## 2017-01-16 DIAGNOSIS — I10 Essential (primary) hypertension: Secondary | ICD-10-CM | POA: Diagnosis not present

## 2017-01-16 DIAGNOSIS — M810 Age-related osteoporosis without current pathological fracture: Secondary | ICD-10-CM | POA: Diagnosis not present

## 2017-01-16 DIAGNOSIS — E1165 Type 2 diabetes mellitus with hyperglycemia: Secondary | ICD-10-CM | POA: Diagnosis not present

## 2017-05-01 DIAGNOSIS — E119 Type 2 diabetes mellitus without complications: Secondary | ICD-10-CM | POA: Diagnosis not present

## 2017-05-01 DIAGNOSIS — I10 Essential (primary) hypertension: Secondary | ICD-10-CM | POA: Diagnosis not present

## 2017-05-01 DIAGNOSIS — F0280 Dementia in other diseases classified elsewhere without behavioral disturbance: Secondary | ICD-10-CM | POA: Diagnosis not present

## 2017-05-01 DIAGNOSIS — M810 Age-related osteoporosis without current pathological fracture: Secondary | ICD-10-CM | POA: Diagnosis not present

## 2017-05-28 DIAGNOSIS — Z23 Encounter for immunization: Secondary | ICD-10-CM | POA: Diagnosis not present

## 2017-06-17 DIAGNOSIS — I10 Essential (primary) hypertension: Secondary | ICD-10-CM | POA: Diagnosis not present

## 2017-06-17 DIAGNOSIS — F0280 Dementia in other diseases classified elsewhere without behavioral disturbance: Secondary | ICD-10-CM | POA: Diagnosis not present

## 2017-06-17 DIAGNOSIS — M810 Age-related osteoporosis without current pathological fracture: Secondary | ICD-10-CM | POA: Diagnosis not present

## 2017-06-17 DIAGNOSIS — E119 Type 2 diabetes mellitus without complications: Secondary | ICD-10-CM | POA: Diagnosis not present

## 2017-07-14 DIAGNOSIS — H2513 Age-related nuclear cataract, bilateral: Secondary | ICD-10-CM | POA: Diagnosis not present

## 2017-07-14 DIAGNOSIS — H40033 Anatomical narrow angle, bilateral: Secondary | ICD-10-CM | POA: Diagnosis not present

## 2017-07-23 DIAGNOSIS — F0280 Dementia in other diseases classified elsewhere without behavioral disturbance: Secondary | ICD-10-CM | POA: Diagnosis not present

## 2017-07-23 DIAGNOSIS — E119 Type 2 diabetes mellitus without complications: Secondary | ICD-10-CM | POA: Diagnosis not present

## 2017-07-23 DIAGNOSIS — M810 Age-related osteoporosis without current pathological fracture: Secondary | ICD-10-CM | POA: Diagnosis not present

## 2017-07-23 DIAGNOSIS — I10 Essential (primary) hypertension: Secondary | ICD-10-CM | POA: Diagnosis not present

## 2017-07-31 DIAGNOSIS — M810 Age-related osteoporosis without current pathological fracture: Secondary | ICD-10-CM | POA: Diagnosis not present

## 2017-07-31 DIAGNOSIS — I10 Essential (primary) hypertension: Secondary | ICD-10-CM | POA: Diagnosis not present

## 2017-07-31 DIAGNOSIS — F0280 Dementia in other diseases classified elsewhere without behavioral disturbance: Secondary | ICD-10-CM | POA: Diagnosis not present

## 2017-07-31 DIAGNOSIS — I5032 Chronic diastolic (congestive) heart failure: Secondary | ICD-10-CM | POA: Diagnosis not present

## 2017-07-31 DIAGNOSIS — E119 Type 2 diabetes mellitus without complications: Secondary | ICD-10-CM | POA: Diagnosis not present

## 2017-08-01 DIAGNOSIS — Z79899 Other long term (current) drug therapy: Secondary | ICD-10-CM | POA: Diagnosis not present

## 2017-08-01 DIAGNOSIS — F0280 Dementia in other diseases classified elsewhere without behavioral disturbance: Secondary | ICD-10-CM | POA: Diagnosis not present

## 2017-08-01 DIAGNOSIS — I5032 Chronic diastolic (congestive) heart failure: Secondary | ICD-10-CM | POA: Diagnosis not present

## 2017-08-01 DIAGNOSIS — E119 Type 2 diabetes mellitus without complications: Secondary | ICD-10-CM | POA: Diagnosis not present

## 2017-08-01 DIAGNOSIS — I10 Essential (primary) hypertension: Secondary | ICD-10-CM | POA: Diagnosis not present

## 2017-08-01 DIAGNOSIS — M810 Age-related osteoporosis without current pathological fracture: Secondary | ICD-10-CM | POA: Diagnosis not present

## 2017-08-16 DIAGNOSIS — Z8052 Family history of malignant neoplasm of bladder: Secondary | ICD-10-CM | POA: Diagnosis not present

## 2017-08-16 DIAGNOSIS — R197 Diarrhea, unspecified: Secondary | ICD-10-CM | POA: Diagnosis not present

## 2017-08-16 DIAGNOSIS — R001 Bradycardia, unspecified: Secondary | ICD-10-CM | POA: Diagnosis not present

## 2017-08-16 DIAGNOSIS — M6281 Muscle weakness (generalized): Secondary | ICD-10-CM | POA: Diagnosis not present

## 2017-08-16 DIAGNOSIS — J310 Chronic rhinitis: Secondary | ICD-10-CM | POA: Diagnosis present

## 2017-08-16 DIAGNOSIS — R636 Underweight: Secondary | ICD-10-CM | POA: Diagnosis present

## 2017-08-16 DIAGNOSIS — M810 Age-related osteoporosis without current pathological fracture: Secondary | ICD-10-CM | POA: Diagnosis present

## 2017-08-16 DIAGNOSIS — E1140 Type 2 diabetes mellitus with diabetic neuropathy, unspecified: Secondary | ICD-10-CM | POA: Diagnosis present

## 2017-08-16 DIAGNOSIS — I10 Essential (primary) hypertension: Secondary | ICD-10-CM | POA: Diagnosis not present

## 2017-08-16 DIAGNOSIS — I2510 Atherosclerotic heart disease of native coronary artery without angina pectoris: Secondary | ICD-10-CM | POA: Diagnosis present

## 2017-08-16 DIAGNOSIS — R41841 Cognitive communication deficit: Secondary | ICD-10-CM | POA: Diagnosis not present

## 2017-08-16 DIAGNOSIS — Z7982 Long term (current) use of aspirin: Secondary | ICD-10-CM | POA: Diagnosis not present

## 2017-08-16 DIAGNOSIS — M79652 Pain in left thigh: Secondary | ICD-10-CM | POA: Diagnosis not present

## 2017-08-16 DIAGNOSIS — I509 Heart failure, unspecified: Secondary | ICD-10-CM | POA: Diagnosis present

## 2017-08-16 DIAGNOSIS — R079 Chest pain, unspecified: Secondary | ICD-10-CM | POA: Diagnosis not present

## 2017-08-16 DIAGNOSIS — T447X5A Adverse effect of beta-adrenoreceptor antagonists, initial encounter: Secondary | ICD-10-CM | POA: Diagnosis present

## 2017-08-16 DIAGNOSIS — N179 Acute kidney failure, unspecified: Secondary | ICD-10-CM | POA: Diagnosis not present

## 2017-08-16 DIAGNOSIS — K589 Irritable bowel syndrome without diarrhea: Secondary | ICD-10-CM | POA: Diagnosis present

## 2017-08-16 DIAGNOSIS — R1312 Dysphagia, oropharyngeal phase: Secondary | ICD-10-CM | POA: Diagnosis not present

## 2017-08-16 DIAGNOSIS — Z8249 Family history of ischemic heart disease and other diseases of the circulatory system: Secondary | ICD-10-CM | POA: Diagnosis not present

## 2017-08-16 DIAGNOSIS — I130 Hypertensive heart and chronic kidney disease with heart failure and stage 1 through stage 4 chronic kidney disease, or unspecified chronic kidney disease: Secondary | ICD-10-CM | POA: Diagnosis not present

## 2017-08-16 DIAGNOSIS — R2689 Other abnormalities of gait and mobility: Secondary | ICD-10-CM | POA: Diagnosis not present

## 2017-08-16 DIAGNOSIS — A045 Campylobacter enteritis: Secondary | ICD-10-CM | POA: Diagnosis not present

## 2017-08-16 DIAGNOSIS — J45909 Unspecified asthma, uncomplicated: Secondary | ICD-10-CM | POA: Diagnosis present

## 2017-08-16 DIAGNOSIS — Z888 Allergy status to other drugs, medicaments and biological substances status: Secondary | ICD-10-CM | POA: Diagnosis not present

## 2017-08-16 DIAGNOSIS — E860 Dehydration: Secondary | ICD-10-CM | POA: Diagnosis present

## 2017-08-16 DIAGNOSIS — N189 Chronic kidney disease, unspecified: Secondary | ICD-10-CM | POA: Diagnosis present

## 2017-08-16 DIAGNOSIS — S79922A Unspecified injury of left thigh, initial encounter: Secondary | ICD-10-CM | POA: Diagnosis not present

## 2017-08-16 DIAGNOSIS — E1122 Type 2 diabetes mellitus with diabetic chronic kidney disease: Secondary | ICD-10-CM | POA: Diagnosis present

## 2017-08-16 DIAGNOSIS — F0390 Unspecified dementia without behavioral disturbance: Secondary | ICD-10-CM | POA: Diagnosis present

## 2017-08-16 DIAGNOSIS — M940 Chondrocostal junction syndrome [Tietze]: Secondary | ICD-10-CM | POA: Diagnosis not present

## 2017-08-16 DIAGNOSIS — Z79899 Other long term (current) drug therapy: Secondary | ICD-10-CM | POA: Diagnosis not present

## 2017-08-16 DIAGNOSIS — E119 Type 2 diabetes mellitus without complications: Secondary | ICD-10-CM | POA: Diagnosis present

## 2017-08-16 DIAGNOSIS — Z681 Body mass index (BMI) 19 or less, adult: Secondary | ICD-10-CM | POA: Diagnosis not present

## 2017-08-16 DIAGNOSIS — R0789 Other chest pain: Secondary | ICD-10-CM | POA: Diagnosis not present

## 2017-08-16 DIAGNOSIS — E785 Hyperlipidemia, unspecified: Secondary | ICD-10-CM | POA: Diagnosis present

## 2017-08-20 DIAGNOSIS — R079 Chest pain, unspecified: Secondary | ICD-10-CM | POA: Diagnosis not present

## 2017-08-20 DIAGNOSIS — R41841 Cognitive communication deficit: Secondary | ICD-10-CM | POA: Diagnosis not present

## 2017-08-20 DIAGNOSIS — Z681 Body mass index (BMI) 19 or less, adult: Secondary | ICD-10-CM | POA: Diagnosis not present

## 2017-08-20 DIAGNOSIS — M940 Chondrocostal junction syndrome [Tietze]: Secondary | ICD-10-CM | POA: Diagnosis not present

## 2017-08-20 DIAGNOSIS — I130 Hypertensive heart and chronic kidney disease with heart failure and stage 1 through stage 4 chronic kidney disease, or unspecified chronic kidney disease: Secondary | ICD-10-CM | POA: Diagnosis not present

## 2017-08-20 DIAGNOSIS — M6281 Muscle weakness (generalized): Secondary | ICD-10-CM | POA: Diagnosis not present

## 2017-08-20 DIAGNOSIS — I10 Essential (primary) hypertension: Secondary | ICD-10-CM | POA: Diagnosis not present

## 2017-08-20 DIAGNOSIS — R2689 Other abnormalities of gait and mobility: Secondary | ICD-10-CM | POA: Diagnosis not present

## 2017-08-20 DIAGNOSIS — A045 Campylobacter enteritis: Secondary | ICD-10-CM | POA: Diagnosis not present

## 2017-08-20 DIAGNOSIS — N179 Acute kidney failure, unspecified: Secondary | ICD-10-CM | POA: Diagnosis not present

## 2017-08-20 DIAGNOSIS — M79652 Pain in left thigh: Secondary | ICD-10-CM | POA: Diagnosis not present

## 2017-08-20 DIAGNOSIS — J310 Chronic rhinitis: Secondary | ICD-10-CM | POA: Diagnosis not present

## 2017-08-20 DIAGNOSIS — E860 Dehydration: Secondary | ICD-10-CM | POA: Diagnosis not present

## 2017-08-20 DIAGNOSIS — E785 Hyperlipidemia, unspecified: Secondary | ICD-10-CM | POA: Diagnosis not present

## 2017-08-20 DIAGNOSIS — F0390 Unspecified dementia without behavioral disturbance: Secondary | ICD-10-CM | POA: Diagnosis not present

## 2017-08-20 DIAGNOSIS — E119 Type 2 diabetes mellitus without complications: Secondary | ICD-10-CM | POA: Diagnosis not present

## 2017-08-20 DIAGNOSIS — R1312 Dysphagia, oropharyngeal phase: Secondary | ICD-10-CM | POA: Diagnosis not present

## 2017-08-20 DIAGNOSIS — T447X5A Adverse effect of beta-adrenoreceptor antagonists, initial encounter: Secondary | ICD-10-CM | POA: Diagnosis not present

## 2017-08-20 DIAGNOSIS — R001 Bradycardia, unspecified: Secondary | ICD-10-CM | POA: Diagnosis not present

## 2017-09-10 DIAGNOSIS — R2689 Other abnormalities of gait and mobility: Secondary | ICD-10-CM | POA: Diagnosis not present

## 2017-09-10 DIAGNOSIS — M6281 Muscle weakness (generalized): Secondary | ICD-10-CM | POA: Diagnosis not present

## 2017-09-10 DIAGNOSIS — A045 Campylobacter enteritis: Secondary | ICD-10-CM | POA: Diagnosis not present

## 2017-09-10 DIAGNOSIS — M940 Chondrocostal junction syndrome [Tietze]: Secondary | ICD-10-CM | POA: Diagnosis not present

## 2017-09-18 DIAGNOSIS — M810 Age-related osteoporosis without current pathological fracture: Secondary | ICD-10-CM | POA: Diagnosis not present

## 2017-09-18 DIAGNOSIS — F0280 Dementia in other diseases classified elsewhere without behavioral disturbance: Secondary | ICD-10-CM | POA: Diagnosis not present

## 2017-09-18 DIAGNOSIS — I10 Essential (primary) hypertension: Secondary | ICD-10-CM | POA: Diagnosis not present

## 2017-09-18 DIAGNOSIS — Z1389 Encounter for screening for other disorder: Secondary | ICD-10-CM | POA: Diagnosis not present

## 2017-09-18 DIAGNOSIS — I5032 Chronic diastolic (congestive) heart failure: Secondary | ICD-10-CM | POA: Diagnosis not present

## 2017-09-18 DIAGNOSIS — Z0000 Encounter for general adult medical examination without abnormal findings: Secondary | ICD-10-CM | POA: Diagnosis not present

## 2017-09-18 DIAGNOSIS — E119 Type 2 diabetes mellitus without complications: Secondary | ICD-10-CM | POA: Diagnosis not present

## 2018-07-17 ENCOUNTER — Other Ambulatory Visit: Payer: Self-pay

## 2018-07-17 ENCOUNTER — Encounter: Payer: Self-pay | Admitting: Physician Assistant

## 2018-07-17 ENCOUNTER — Inpatient Hospital Stay (HOSPITAL_COMMUNITY)
Admission: RE | Disposition: A | Discharge status: Home / Self Care | Payer: Self-pay | Source: Other Acute Inpatient Hospital | Attending: Cardiology

## 2018-07-17 ENCOUNTER — Inpatient Hospital Stay (HOSPITAL_COMMUNITY)
Admission: RE | Admit: 2018-07-17 | Discharge: 2018-07-19 | DRG: 287 | Disposition: A | Discharge status: Home / Self Care | Payer: Medicare Other | Source: Other Acute Inpatient Hospital | Attending: Cardiology | Admitting: Cardiology

## 2018-07-17 DIAGNOSIS — R001 Bradycardia, unspecified: Secondary | ICD-10-CM | POA: Diagnosis present

## 2018-07-17 DIAGNOSIS — K219 Gastro-esophageal reflux disease without esophagitis: Secondary | ICD-10-CM | POA: Diagnosis present

## 2018-07-17 DIAGNOSIS — I340 Nonrheumatic mitral (valve) insufficiency: Secondary | ICD-10-CM | POA: Diagnosis present

## 2018-07-17 DIAGNOSIS — I361 Nonrheumatic tricuspid (valve) insufficiency: Secondary | ICD-10-CM | POA: Diagnosis not present

## 2018-07-17 DIAGNOSIS — I255 Ischemic cardiomyopathy: Secondary | ICD-10-CM | POA: Diagnosis present

## 2018-07-17 DIAGNOSIS — I440 Atrioventricular block, first degree: Secondary | ICD-10-CM | POA: Diagnosis present

## 2018-07-17 DIAGNOSIS — I25110 Atherosclerotic heart disease of native coronary artery with unstable angina pectoris: Secondary | ICD-10-CM

## 2018-07-17 DIAGNOSIS — Z888 Allergy status to other drugs, medicaments and biological substances status: Secondary | ICD-10-CM

## 2018-07-17 DIAGNOSIS — F05 Delirium due to known physiological condition: Secondary | ICD-10-CM | POA: Diagnosis present

## 2018-07-17 DIAGNOSIS — Z90710 Acquired absence of both cervix and uterus: Secondary | ICD-10-CM | POA: Diagnosis not present

## 2018-07-17 DIAGNOSIS — Z951 Presence of aortocoronary bypass graft: Secondary | ICD-10-CM | POA: Diagnosis not present

## 2018-07-17 DIAGNOSIS — I447 Left bundle-branch block, unspecified: Secondary | ICD-10-CM | POA: Diagnosis present

## 2018-07-17 DIAGNOSIS — I495 Sick sinus syndrome: Secondary | ICD-10-CM | POA: Diagnosis not present

## 2018-07-17 DIAGNOSIS — Z7984 Long term (current) use of oral hypoglycemic drugs: Secondary | ICD-10-CM

## 2018-07-17 DIAGNOSIS — E785 Hyperlipidemia, unspecified: Secondary | ICD-10-CM | POA: Diagnosis present

## 2018-07-17 DIAGNOSIS — I10 Essential (primary) hypertension: Secondary | ICD-10-CM | POA: Diagnosis present

## 2018-07-17 DIAGNOSIS — Z7982 Long term (current) use of aspirin: Secondary | ICD-10-CM

## 2018-07-17 DIAGNOSIS — Z7983 Long term (current) use of bisphosphonates: Secondary | ICD-10-CM

## 2018-07-17 DIAGNOSIS — F0390 Unspecified dementia without behavioral disturbance: Secondary | ICD-10-CM | POA: Diagnosis present

## 2018-07-17 DIAGNOSIS — I252 Old myocardial infarction: Secondary | ICD-10-CM

## 2018-07-17 DIAGNOSIS — Z8249 Family history of ischemic heart disease and other diseases of the circulatory system: Secondary | ICD-10-CM | POA: Diagnosis not present

## 2018-07-17 DIAGNOSIS — Z79899 Other long term (current) drug therapy: Secondary | ICD-10-CM

## 2018-07-17 DIAGNOSIS — R011 Cardiac murmur, unspecified: Secondary | ICD-10-CM | POA: Diagnosis present

## 2018-07-17 DIAGNOSIS — I472 Ventricular tachycardia: Secondary | ICD-10-CM | POA: Diagnosis present

## 2018-07-17 DIAGNOSIS — E119 Type 2 diabetes mellitus without complications: Secondary | ICD-10-CM | POA: Diagnosis present

## 2018-07-17 DIAGNOSIS — I213 ST elevation (STEMI) myocardial infarction of unspecified site: Secondary | ICD-10-CM | POA: Diagnosis not present

## 2018-07-17 DIAGNOSIS — I6521 Occlusion and stenosis of right carotid artery: Secondary | ICD-10-CM | POA: Diagnosis present

## 2018-07-17 HISTORY — DX: Type 2 diabetes mellitus without complications: E11.9

## 2018-07-17 HISTORY — DX: Hyperlipidemia, unspecified: E78.5

## 2018-07-17 HISTORY — DX: Essential (primary) hypertension: I10

## 2018-07-17 HISTORY — PX: TEMPORARY PACEMAKER: CATH118268

## 2018-07-17 HISTORY — DX: Occlusion and stenosis of unspecified carotid artery: I65.29

## 2018-07-17 HISTORY — DX: Left bundle-branch block, unspecified: I44.7

## 2018-07-17 HISTORY — DX: Localized edema: R60.0

## 2018-07-17 HISTORY — DX: Cardiomyopathy, unspecified: I42.9

## 2018-07-17 HISTORY — DX: Atherosclerotic heart disease of native coronary artery without angina pectoris: I25.10

## 2018-07-17 HISTORY — PX: LEFT HEART CATH AND CORS/GRAFTS ANGIOGRAPHY: CATH118250

## 2018-07-17 HISTORY — PX: LEFT HEART CATH AND CORONARY ANGIOGRAPHY: CATH118249

## 2018-07-17 HISTORY — DX: Gastro-esophageal reflux disease without esophagitis: K21.9

## 2018-07-17 LAB — COMPREHENSIVE METABOLIC PANEL
ALT: 11 U/L (ref 0–44)
AST: 18 U/L (ref 15–41)
Albumin: 2.9 g/dL — ABNORMAL LOW (ref 3.5–5.0)
Alkaline Phosphatase: 48 U/L (ref 38–126)
Anion gap: 8 (ref 5–15)
BUN: 8 mg/dL (ref 8–23)
CO2: 18 mmol/L — ABNORMAL LOW (ref 22–32)
Calcium: 9 mg/dL (ref 8.9–10.3)
Chloride: 115 mmol/L — ABNORMAL HIGH (ref 98–111)
Creatinine, Ser: 0.73 mg/dL (ref 0.44–1.00)
GFR calc Af Amer: >60 mL/min (ref >60)
Glucose, Bld: 86 mg/dL (ref 70–99)
Potassium: 3.7 mmol/L (ref 3.5–5.1)
Sodium: 141 mmol/L (ref 135–145)
Total Bilirubin: 0.7 mg/dL (ref 0.3–1.2)
Total Protein: 6.2 g/dL — ABNORMAL LOW (ref 6.5–8.1)

## 2018-07-17 LAB — CBC
HCT: 34.1 % — ABNORMAL LOW (ref 36.0–46.0)
Hemoglobin: 11.3 g/dL — ABNORMAL LOW (ref 12.0–15.0)
MCH: 32.1 pg (ref 26.0–34.0)
MCHC: 33.1 g/dL (ref 30.0–36.0)
MCV: 96.9 fL (ref 80.0–100.0)
Platelets: 214 10*3/uL (ref 150–400)
RBC: 3.52 MIL/uL — ABNORMAL LOW (ref 3.87–5.11)
RDW: 13.2 % (ref 11.5–15.5)
WBC: 5.9 10*3/uL (ref 4.0–10.5)
nRBC: 0 % (ref 0.0–0.2)

## 2018-07-17 LAB — HEMOGLOBIN A1C
Hgb A1c MFr Bld: 5.6 % (ref 4.8–5.6)
MEAN PLASMA GLUCOSE: 114.02 mg/dL

## 2018-07-17 LAB — PROTIME-INR
INR: 1.31
Prothrombin Time: 16.1 seconds — ABNORMAL HIGH (ref 11.4–15.2)

## 2018-07-17 LAB — POCT I-STAT, CHEM 8
BUN: 9 mg/dL (ref 8–23)
Calcium, Ion: 1.21 mmol/L (ref 1.15–1.40)
Chloride: 112 mmol/L — ABNORMAL HIGH (ref 98–111)
Creatinine, Ser: 0.6 mg/dL (ref 0.44–1.00)
GLUCOSE: 91 mg/dL (ref 70–99)
HCT: 33 % — ABNORMAL LOW (ref 36.0–46.0)
Hemoglobin: 11.2 g/dL — ABNORMAL LOW (ref 12.0–15.0)
Potassium: 3.7 mmol/L (ref 3.5–5.1)
Sodium: 142 mmol/L (ref 135–145)
TCO2: 21 mmol/L — ABNORMAL LOW (ref 22–32)

## 2018-07-17 LAB — TROPONIN I: Troponin I: <0.03 ng/mL (ref <0.03)

## 2018-07-17 LAB — LIPID PANEL
CHOL/HDL RATIO: 2.1 ratio
Cholesterol: 142 mg/dL (ref 0–200)
HDL: 68 mg/dL (ref >40)
LDL Cholesterol: 70 mg/dL (ref 0–99)
Triglycerides: 21 mg/dL (ref <150)
VLDL: 4 mg/dL (ref 0–40)

## 2018-07-17 LAB — TSH: TSH: 1.69 u[IU]/mL (ref 0.350–4.500)

## 2018-07-17 LAB — APTT: aPTT: 199 seconds (ref 24–36)

## 2018-07-17 LAB — POCT ACTIVATED CLOTTING TIME: Activated Clotting Time: 169 seconds

## 2018-07-17 LAB — MRSA PCR SCREENING: MRSA by PCR: NEGATIVE

## 2018-07-17 SURGERY — LEFT HEART CATH AND CORONARY ANGIOGRAPHY
Anesthesia: LOCAL

## 2018-07-17 MED ORDER — HEPARIN (PORCINE) IN NACL 1000-0.9 UT/500ML-% IV SOLN
INTRAVENOUS | Status: DC | PRN
  Administered 2018-07-17 (×2): 500 mL

## 2018-07-17 MED ORDER — ASPIRIN EC 81 MG PO TBEC
81.0000 mg | DELAYED_RELEASE_TABLET | Freq: Every day | ORAL | Status: DC
  Administered 2018-07-18 – 2018-07-19 (×2): 81 mg via ORAL
  Filled 2018-07-17 (×2): qty 1

## 2018-07-17 MED ORDER — LIDOCAINE HCL (PF) 1 % IJ SOLN
INTRAMUSCULAR | Status: AC
  Filled 2018-07-17: qty 30

## 2018-07-17 MED ORDER — MIDAZOLAM HCL 2 MG/2ML IJ SOLN
INTRAMUSCULAR | Status: AC
  Filled 2018-07-17: qty 2

## 2018-07-17 MED ORDER — LISINOPRIL 10 MG PO TABS: 10.0000 mg | ORAL_TABLET | Freq: Every day | ORAL | Status: DC

## 2018-07-17 MED ORDER — VERAPAMIL HCL 2.5 MG/ML IV SOLN
INTRAVENOUS | Status: AC
  Filled 2018-07-17: qty 2

## 2018-07-17 MED ORDER — HEPARIN SODIUM (PORCINE) 5000 UNIT/ML IJ SOLN
5000.0000 [IU] | Freq: Three times a day (TID) | INTRAMUSCULAR | Status: DC
  Administered 2018-07-18 – 2018-07-19 (×4): 5000 [IU] via SUBCUTANEOUS
  Filled 2018-07-17 (×4): qty 1

## 2018-07-17 MED ORDER — HEPARIN (PORCINE) IN NACL 1000-0.9 UT/500ML-% IV SOLN
INTRAVENOUS | Status: AC
  Filled 2018-07-17: qty 500

## 2018-07-17 MED ORDER — SODIUM CHLORIDE 0.9 % IV SOLN
INTRAVENOUS | Status: AC
  Administered 2018-07-17: 18:00:00 via INTRAVENOUS

## 2018-07-17 MED ORDER — NITROGLYCERIN 0.4 MG SL SUBL: 0.4000 mg | SUBLINGUAL_TABLET | SUBLINGUAL | Status: DC | PRN

## 2018-07-17 MED ORDER — ACETAMINOPHEN 325 MG PO TABS: 650.0000 mg | ORAL_TABLET | ORAL | Status: DC | PRN

## 2018-07-17 MED ORDER — NITROGLYCERIN 1 MG/10 ML FOR IR/CATH LAB
INTRA_ARTERIAL | Status: AC
  Filled 2018-07-17: qty 10

## 2018-07-17 MED ORDER — SODIUM CHLORIDE 0.9 % IV SOLN
INTRAVENOUS | Status: DC | PRN
  Administered 2018-07-17 (×2): 10 mL/h via INTRAVENOUS

## 2018-07-17 MED ORDER — ONDANSETRON HCL 4 MG/2ML IJ SOLN: 4.0000 mg | Freq: Four times a day (QID) | INTRAMUSCULAR | Status: DC | PRN

## 2018-07-17 MED ORDER — LIDOCAINE HCL (PF) 1 % IJ SOLN
INTRAMUSCULAR | Status: DC | PRN
  Administered 2018-07-17: 6 mL via SUBCUTANEOUS
  Administered 2018-07-17: 8 mL via SUBCUTANEOUS

## 2018-07-17 MED ORDER — HEPARIN (PORCINE) IN NACL 1000-0.9 UT/500ML-% IV SOLN
INTRAVENOUS | Status: DC | PRN
  Administered 2018-07-17: 500 mL

## 2018-07-17 MED ORDER — ATORVASTATIN CALCIUM 40 MG PO TABS
40.0000 mg | ORAL_TABLET | Freq: Every day | ORAL | Status: DC
  Administered 2018-07-17 – 2018-07-18 (×2): 40 mg via ORAL
  Filled 2018-07-17 (×2): qty 1

## 2018-07-17 MED ORDER — SODIUM CHLORIDE 0.9 % IV SOLN
250.0000 mL | INTRAVENOUS | Status: DC | PRN
  Administered 2018-07-17: 18:00:00 via INTRAVENOUS

## 2018-07-17 MED ORDER — IOHEXOL 350 MG/ML SOLN
INTRAVENOUS | Status: DC | PRN
  Administered 2018-07-17: 130 mL via INTRA_ARTERIAL

## 2018-07-17 MED ORDER — SODIUM CHLORIDE 0.9% FLUSH: 3.0000 mL | INTRAVENOUS | Status: DC | PRN

## 2018-07-17 MED ORDER — SODIUM CHLORIDE 0.9% FLUSH
3.0000 mL | Freq: Two times a day (BID) | INTRAVENOUS | Status: DC
  Administered 2018-07-17 – 2018-07-19 (×4): 3 mL via INTRAVENOUS

## 2018-07-17 MED ORDER — DONEPEZIL HCL 10 MG PO TABS
5.0000 mg | ORAL_TABLET | Freq: Every day | ORAL | Status: DC
  Administered 2018-07-18 – 2018-07-19 (×2): 5 mg via ORAL
  Filled 2018-07-17 (×2): qty 1

## 2018-07-17 SURGICAL SUPPLY — 13 items
CABLE ADAPT CONN TEMP 6FT (ADAPTER) ×2 IMPLANT
CATH INFINITI 5FR MULTPACK ANG (CATHETERS) ×2 IMPLANT
CATH S G BIP PACING (SET/KITS/TRAYS/PACK) ×2 IMPLANT
CLOSURE MYNX CONTROL 6F/7F (Vascular Products) ×2 IMPLANT
KIT ENCORE 26 ADVANTAGE (KITS) ×2 IMPLANT
KIT HEART LEFT (KITS) ×2 IMPLANT
PACK CARDIAC CATHETERIZATION (CUSTOM PROCEDURE TRAY) ×2 IMPLANT
SHEATH PINNACLE 6F 10CM (SHEATH) ×4 IMPLANT
SHEATH PROBE COVER 6X72 (BAG) ×2 IMPLANT
SYR MEDRAD MARK 7 150ML (SYRINGE) ×2 IMPLANT
TRANSDUCER W/STOPCOCK (MISCELLANEOUS) ×2 IMPLANT
TUBING CIL FLEX 10 FLL-RA (TUBING) ×2 IMPLANT
WIRE EMERALD 3MM-J .035X150CM (WIRE) ×2 IMPLANT

## 2018-07-17 NOTE — H&P (Addendum)
Cardiology History & Physical    Patient ID: [name] MRN: [id number], DOB: November 29, 1940 Date of Encounter: 07/17/2018, 3:47 PM Primary Physician: No primary care provider on file. Primary Cardiologist: No primary care provider on file. None. Remotely seen by Dr. Dannielle Burn. Arrived from [facility]. Primary Electrophysiologist:  None  Chief Complaint: chest pain Reason for Admission: STEMI, bradycardia Requesting MD: [facility]/Dr Felton Clinton  HPI: [name] is a 77 y.o. female with history of probable dementia (per pt, Aricept on ER med list), CAD s/p CABGx3 in 2009 (LIMA-LAD, SVG-acute marginal, SVG-OM), DM, HTN, dyslipidemia, 40-60% RICA by duplex 2009, incomplete LBBB, GERD, esophageal stricture, lower exremity edema who presented to [facility] and was transferred to [facility] as a potential Code STEMI and bradycardia with HR in the 30s-40s.  She remotely has a h/o NICM followed by [facility]. Per a remote note from Dr. Dannielle Burn, subsequent studies at [facility] suggested normalization of LVF both by echocardiography as well as nuclear imaging studies in 2004 and 2006. In 2009 she presented with symptoms of acute CHF and was found to have NSTEMI and EF 30%. Cath demonstrated multivessel disease and she underwent CABGx3 by Dr. Roxan Hockey.   The patient is a poor historian and has dementia. She does not think she is followed by a cardiologist. Her family indicates she wasn't feeling well earlier. They went to lunch and after she got home she had difficulty catching her breath and chest pain that felt like indigestion. She also vomited once. She was given Pepto Bismol but taken to the hospital due to persist symptoms. CP started around 11am per family. She presented to [facility] where EKG identified a possible STEMI with anterior ST upsloping elevation in V2-V3, Q waves inferiorly and TWI in I, aVL. The ST-T changes appear changed since 2015 EKG transmitted. She received 386m  ASA, heparin bolus and NTG. Presenting BP was 170/58, pulse ox 100%. She was emergently transported from [facility] to the cath lab. She is pain free on arrival. BP 116/66, HR 37. She knows her name and that she is at the hospital but does not know what year or month it is.  Med List per [facility] (unconfirmed): alendronate 76mweekly, gabapentin 60063maily, aspirin 27m11mily, atorvastatin 40mg1mly, donezepil 5mg q26m gemfibrozil 600mg B66mlisinopril 10mg da35m montelukast 10mg dai82mHusband thought he had med list but does not know.  [facility], BUN 10, Cr 0.75, calcium 9.0, albumin 3.7, total protein 6.7, alk phos 63, AST 17, ALT 6, Na 139, K 3.8, Cl 105, CO2 22.7, CPK 63, troponin <0.01, WBC 5.8, Hgb 12.2, Hgb 36.9, Plt 230.  [facility]  Past Medical History:  Diagnosis Date  . CAD (coronary artery disease)    a. Coronary artery bypass grafting x3 using LIMA-LAD, SVG-acute marginal, SVG-OM in 2009.  . Cardiomyopathy (HCC)   . [place]id artery stenosis    a. RICA 40-60% by duplex in 2009.  . Diabetes mellitus (HCC)   . [place]   . Essential hypertension   . GERD (gastroesophageal reflux disease)    a. Gastroesophageal reflux disease with previous esophageal stricture.  . Incomplete left bundle branch block   . Lower extremity edema      Surgical History:  Past Surgical History:  Procedure Laterality Date  . ABDOMINAL HYSTERECTOMY    . CORONARY ARTERY BYPASS GRAFT       Home Meds: As above will need reconciling  Allergies:  Allergies  Allergen Reactions  . Diazepam   . Niacin And Related     Social History   Socioeconomic History  . Marital status: Married    Spouse name: Not on file  . Number of children: Not on file  . Years of education: Not on file  . Highest education level: Not on file  Occupational History  . Not on file  Social Needs  . Financial resource strain: Not on file  . Food insecurity:    Worry:  Not on file    Inability: Not on file  . Transportation needs:    Medical: Not on file    Non-medical: Not on file  Tobacco Use  . Smoking status: Never Smoker  Substance and Sexual Activity  . Alcohol use: Never    Frequency: Never  . Drug use: Never  . Sexual activity: Not on file  Lifestyle  . Physical activity:    Days per week: Not on file    Minutes per session: Not on file  . Stress: Not on file  Relationships  . Social connections:    Talks on phone: Not on file    Gets together: Not on file    Attends religious service: Not on file    Active member of club or organization: Not on file    Attends meetings of clubs or organizations: Not on file    Relationship status: Not on file  . Intimate partner violence:    Fear of current or ex partner: Not on file    Emotionally abused: Not on file    Physically abused: Not on file    Forced sexual activity: Not on file  Other Topics Concern  . Not on file  Social History Narrative  . Not on file     Family History  Problem Relation Age of Onset  . Heart disease Father     Review of Systems: All other systems reviewed and are otherwise negative except as noted above. Limited by patient's dementia  EKG: sinus bradycardia with HR 42bpm, with IVCD, broad Q waves in III, avF, upsloped ST segpemtn in leads II, III, TWI I, avL  Physical Exam: HR 37, BP 116/66, POx 100%, RR 16, Temp 36.2 (97.16) General: Frail appearing elderly WF in no acute distress. Head: Normocephalic, atraumatic, sclera non-icteric, no xanthomas, nares are without discharge.  Neck: Difficult to auscultate carotids in setting of acute management. JVD not elevated. Lungs: Clear bilaterally to auscultation without wheezes, rales, or rhonchi. Breathing is unlabored. Heart: Reg rhythm, bradycardic, with S1 S2. No murmurs, rubs, or gallops appreciated. Abdomen: Soft, non-tender, non-distended with normoactive bowel sounds. No hepatomegaly. No  rebound/guarding. No obvious abdominal masses. Msk:  Strength and tone appear normal for age. Extremities: No clubbing or cyanosis. Trace pedal edema L>R.  Distal pedal pulses are 2+ and equal bilaterally. Neuro: Alert and oriented to place, name, but not to time; poor historian; admits to chronic memory issues, no acute focal deficit otherwise Psych:  Responds to questions appropriately with a nonchalant affect.    Assessment and Plan   1. Possible acute code STEMI in patient with CAD s/p prior CABG 2. Marked sinus bradycardia 3. H/o severe cardiomyopathy (previously both NICM and ICM) 4. HTN 5. Hyperlipidemia with unknown lipids 6. Diabetes, not currently on any medications per [facility] list 7. Probable dementia, on Aricept  Patient taken acutely to the cath lab, further recs pending. Pharmacy asked to assist with formal med rec. [facility] indicated allergies to  Niacin and Diazepam. She does not appear to have been on any AVN blocking agents prior to admission. Regarding code status, her family member indicates she had previously indicated a desire not to prolong life if in a vegetative state but they are not clear about acute situations. They asked [place] to try and clarify with the patient when able.  Severity of Illness: The appropriate patient status for this patient is INPATIENT. Inpatient status is judged to be reasonable and necessary in order to provide the required intensity of service to ensure the patient's safety. The patient's presenting symptoms, physical exam findings, and initial radiographic and laboratory data in the context of their chronic comorbidities is felt to place them at high risk for further clinical deterioration. Furthermore, it is not anticipated that the patient will be medically stable for discharge from the hospital within 2 midnights of admission. The following factors support the patient status of inpatient.   " The patient's presenting symptoms include chest  pain. " The worrisome physical exam findings include bradycardia. " The initial radiographic and laboratory data are worrisome because of abnormal EKG and marked bradycardia. " The chronic co-morbidities include CAD, dementia and otherwise listed above.   * I certify that at the point of admission it is my clinical judgment that the patient will require inpatient hospital care spanning beyond 2 midnights from the point of admission due to high intensity of service, high risk for further deterioration and high frequency of surveillance required.*    For questions or updates, please contact [facility] Please consult [email or web address] for contact info under Cardiology/STEMI.  Signed, Charlie Pitter, PA-C 07/17/2018, 3:47 PM   I have seen, examined and evaluated the patient this PM in ER along with Melina Copa, PA-C.  After reviewing all the available data and chart, we discussed the patients laboratory, study & physical findings as well as symptoms in detail. I agree with her findings, examination as well as impression recommendations as per our discussion.    She is a 77 year old woman with distant history of CABG who has essentially been lost to follow-up to the [facility] system.  She has had stress test done since her CABG, but not locally.  She has not had a cath since then.  She began having chest pain earlier today around lunchtime.  Went to [facility] [place].  There she was noted to have bradycardia which appeared to be sinus bradycardia with possible ST elevation MI this is confusing because of interventricular conduction delay.  There is suggestion of possible ST elevations in V2 and V1, the ST elevations in the inferior leads are old.  Upon arrival to [facility] she was normally having chest pain, however we decided to go to proceed with cardiac catheterization given her bradycardia and the severe chest pain on admission.  At the time of initial evaluation we did not have her  medication list, but we do not have them to review. Plan: Take emergently to cardiac aspiration lab and will consider temporary pacemaker for bradycardia.  May need EP evaluation if no new ischemic lesion is found.    Glenetta Hew, M.D., M.S. Interventional Cardiologist   Pager # [phone] Phone # [phone] [address] [place], [place] [address]

## 2018-07-17 NOTE — Interval H&P Note (Signed)
30 history and Physical Interval Note:  07/17/2018 4:28 PM  [name]  has presented today for surgery, with the diagnosis of possible Stemi with likely symptomatic bradycardia.  The various methods of treatment have been discussed with the patient and family. After consideration of risks, benefits and other options for treatment, the patient has consented to  Procedure(s): LEFT HEART CATH AND CORONARY ANGIOGRAPHY (N/A) WITH TEMPORARY PACEMAKER as a surgical intervention .  The patient's history has been reviewed, patient examined, no change in status, stable for surgery.  I have reviewed the patient's chart and labs.  Questions were answered to the patient's satisfaction.     Glenetta Hew

## 2018-07-17 NOTE — Progress Notes (Signed)
PTT of >199 received from lab. Patient received heparin during her procedure in the cath lab. Patient is not on heparin at present. Dr. Ellyn Hack paged.

## 2018-07-17 NOTE — Progress Notes (Addendum)
Per family request, spoke with patient directly regarding code status and she affirms she wishes to be full code.  Regarding home meds, await formal med reconciliation. [facility] list indicated alendronate 70mg  weekly, gabapentin 600mg  daily, aspirin 81mg  daily, atorvastatin 40mg  daily, donezepil 5mg  qhs, gemfibrozil 600mg  BID, lisinopril 10mg  daily, montelukast 10mg  daily but family was unable to confirm. For now, ASA and atorvastatin resumed. I put in care order for nursing to call on call provider once list is verified, and signed out to on-call APP to be aware.  Mary Hockey PA-C

## 2018-07-18 ENCOUNTER — Encounter (HOSPITAL_COMMUNITY): Payer: Self-pay | Admitting: *Deleted

## 2018-07-18 ENCOUNTER — Inpatient Hospital Stay (HOSPITAL_COMMUNITY): Payer: Medicare Other

## 2018-07-18 DIAGNOSIS — I255 Ischemic cardiomyopathy: Secondary | ICD-10-CM

## 2018-07-18 DIAGNOSIS — I495 Sick sinus syndrome: Secondary | ICD-10-CM

## 2018-07-18 DIAGNOSIS — I361 Nonrheumatic tricuspid (valve) insufficiency: Secondary | ICD-10-CM

## 2018-07-18 LAB — BASIC METABOLIC PANEL
Anion gap: 10 (ref 5–15)
BUN: 12 mg/dL (ref 8–23)
CO2: 20 mmol/L — ABNORMAL LOW (ref 22–32)
CREATININE: 0.83 mg/dL (ref 0.44–1.00)
Calcium: 8.3 mg/dL — ABNORMAL LOW (ref 8.9–10.3)
Chloride: 110 mmol/L (ref 98–111)
GFR calc Af Amer: >60 mL/min (ref >60)
GFR calc non Af Amer: >60 mL/min (ref >60)
Glucose, Bld: 73 mg/dL (ref 70–99)
Potassium: 3.6 mmol/L (ref 3.5–5.1)
Sodium: 140 mmol/L (ref 135–145)

## 2018-07-18 LAB — CBC
HCT: 33.3 % — ABNORMAL LOW (ref 36.0–46.0)
Hemoglobin: 10.6 g/dL — ABNORMAL LOW (ref 12.0–15.0)
MCH: 31.6 pg (ref 26.0–34.0)
MCHC: 31.8 g/dL (ref 30.0–36.0)
MCV: 99.4 fL (ref 80.0–100.0)
Platelets: 189 10*3/uL (ref 150–400)
RBC: 3.35 MIL/uL — ABNORMAL LOW (ref 3.87–5.11)
RDW: 13.3 % (ref 11.5–15.5)
WBC: 7.1 10*3/uL (ref 4.0–10.5)
nRBC: 0 % (ref 0.0–0.2)

## 2018-07-18 LAB — LIPID PANEL
Cholesterol: 125 mg/dL (ref 0–200)
HDL: 57 mg/dL (ref >40)
LDL CALC: 56 mg/dL (ref 0–99)
Total CHOL/HDL Ratio: 2.2 RATIO
Triglycerides: 62 mg/dL (ref <150)
VLDL: 12 mg/dL (ref 0–40)

## 2018-07-18 LAB — ECHOCARDIOGRAM COMPLETE: Weight: 1626.11 oz

## 2018-07-18 LAB — HEPATIC FUNCTION PANEL
ALBUMIN: 2.6 g/dL — AB (ref 3.5–5.0)
ALT: 10 U/L (ref 0–44)
AST: 17 U/L (ref 15–41)
Alkaline Phosphatase: 47 U/L (ref 38–126)
Bilirubin, Direct: 0.1 mg/dL (ref 0.0–0.2)
Indirect Bilirubin: 0.7 mg/dL (ref 0.3–0.9)
TOTAL PROTEIN: 5.5 g/dL — AB (ref 6.5–8.1)
Total Bilirubin: 0.8 mg/dL (ref 0.3–1.2)

## 2018-07-18 LAB — TROPONIN I: Troponin I: 0.06 ng/mL (ref <0.03)

## 2018-07-18 LAB — TSH: TSH: 1.689 u[IU]/mL (ref 0.350–4.500)

## 2018-07-18 LAB — GLUCOSE, CAPILLARY
Glucose-Capillary: 88 mg/dL (ref 70–99)
Glucose-Capillary: 105 mg/dL — ABNORMAL HIGH (ref 70–99)

## 2018-07-18 NOTE — Consult Note (Signed)
Cardiology Consultation:   Patient ID: [name] MRN: [id number]; DOB: Dec 27, 1940  Admit date: 07/17/2018 Date of Consult: 07/18/2018  Primary Care Provider: No primary care provider on file. Primary Cardiologist: No primary care provider on file.  Primary Electrophysiologist:  None    Patient Profile:   [name] is a 77 y.o. female with a hx of dementia, coronary artery disease status post CABG x3 in 2009, hyperlipidemia, GERD extremity edema who is being seen today for the evaluation of bradycardia at the request of Glenetta Hew.  History of Present Illness:   [name] she presented to [facility] rocking him was transferred to [facility] is a possible STEMI and bradycardia with heart rates in the 30s to 40s.  She has a remote history of nonischemic cardiomyopathy followed by [facility].  In 2009 she presented with symptoms of acute heart failure and was found to have a non-STEMI and an EF of 30%.  Cath demonstrated multivessel coronary disease and she had CABG x3.  Per family, she was not feeling well prior to hospitalization.  She became short of breath with chest pain that felt like indigestion.  EKG showed upsloping ST elevations in V2.  And inferior Q waves.  She was thus taken to the cardiac Cath Lab which showed severe native coronary disease with open grafts and no targets for intervention.  Due to her bradycardia, a temporary wire was placed.  She currently feels well without major complaint.  She has no chest pain or shortness of breath.  She is unaware of her bradycardia.  Past Medical History:  Diagnosis Date  . CAD (coronary artery disease)    a. Coronary artery bypass grafting x3 using LIMA-LAD, SVG-acute marginal, SVG-OM in 2009.  . Cardiomyopathy ([place])   . Carotid artery stenosis    a. RICA 40-60% by duplex in 2009.  . Diabetes mellitus ([place])   . Dyslipidemia   . Essential hypertension   . GERD (gastroesophageal reflux disease)    a. Gastroesophageal  reflux disease with previous esophageal stricture.  . Incomplete left bundle branch block   . Lower extremity edema     Past Surgical History:  Procedure Laterality Date  . ABDOMINAL HYSTERECTOMY    . CORONARY ARTERY BYPASS GRAFT       Home Medications:  Prior to Admission medications   Medication Sig Start Date End Date Taking? Authorizing Provider  alendronate (FOSAMAX) 70 MG tablet Take 70 mg by mouth every Wednesday. Take with a full glass of water on an empty stomach.   Yes [provider]  aspirin EC 81 MG tablet Take 81 mg by mouth daily.   Yes [provider]  atorvastatin (LIPITOR) 40 MG tablet Take 40 mg by mouth daily.   Yes [provider]  bismuth subsalicylate (PEPTO BISMOL) 262 MG/15ML suspension Take 30 mLs by mouth once.   Yes [provider]  carvedilol (COREG) 12.5 MG tablet Take 12.5 mg by mouth 2 (two) times daily with a meal.   Yes [provider]  donepezil (ARICEPT) 5 MG tablet Take 5 mg by mouth daily.   Yes [provider]  furosemide (LASIX) 40 MG tablet Take 40 mg by mouth daily.   Yes [provider]  gemfibrozil (LOPID) 600 MG tablet Take 600 mg by mouth 2 (two) times daily before a meal.   Yes [provider]  lisinopril (PRINIVIL,ZESTRIL) 10 MG tablet Take 10 mg by mouth daily.   Yes [provider]  metFORMIN (GLUCOPHAGE-XR) 500 MG 24 hr tablet Take 500 mg by mouth daily.   Yes [provider]  potassium chloride SA (K-DUR,KLOR-CON) 20 MEQ tablet Take 20 mEq by mouth daily.   Yes [provider]  sitaGLIPtin (JANUVIA) 100 MG tablet Take 100 mg by mouth daily.   Yes [provider]    Inpatient Medications: Scheduled Meds: . aspirin EC  81 mg Oral Daily  . atorvastatin  40 mg Oral q1800  . donepezil  5 mg Oral Daily  . heparin  5,000 Units Subcutaneous Q8H  . lisinopril  10 mg Oral Daily  . sodium chloride flush  3 mL Intravenous Q12H    Continuous Infusions: . sodium chloride 10 mL/hr at 07/18/18 0800   PRN Meds: sodium chloride, acetaminophen, nitroGLYCERIN, ondansetron (ZOFRAN) IV, sodium chloride flush  Allergies:    Allergies  Allergen Reactions  . Diazepam Other (See Comments)    Makes her feel odd  . Niacin And Related Other (See Comments)    Unknown reaction    Social History:   Social History   Socioeconomic History  . Marital status: Married    Spouse name: Not on file  . Number of children: Not on file  . Years of education: Not on file  . Highest education level: Not on file  Occupational History  . Not on file  Social Needs  . Financial resource strain: Not on file  . Food insecurity:    Worry: Not on file    Inability: Not on file  . Transportation needs:    Medical: Not on file    Non-medical: Not on file  Tobacco Use  . Smoking status: Never Smoker  Substance and Sexual Activity  . Alcohol use: Never    Frequency: Never  . Drug use: Never  . Sexual activity: Not on file  Lifestyle  . Physical activity:    Days per week: Not on file    Minutes per session: Not on file  . Stress: Not on file  Relationships  . Social connections:    Talks on phone: Not on file    Gets together: Not on file    Attends religious service: Not on file    Active member of club or organization: Not on file    Attends meetings of clubs or organizations: Not on file    Relationship status: Not on file  . Intimate partner violence:    Fear of current or ex partner: Not on file    Emotionally abused: Not on file    Physically abused: Not on file    Forced sexual activity: Not on file  Other Topics Concern  . Not on file  Social History Narrative  . Not on file    Family History:    Family History  Problem Relation Age of Onset  . Heart disease Father      ROS:  Please see the history of present illness.  All other ROS reviewed and negative.     Physical Exam/Data:   Vitals:    07/18/18 0644 07/18/18 0700 07/18/18 0724 07/18/18 0800  BP:  (!) 129/43  (!) 137/49  Pulse: (!) 42 (!) 43  (!) 44  Resp: 19 19  (!) 27  Temp:   98.4 F (36.9 C)   TempSrc:   Oral   SpO2: 96% 96%  97%  Weight:        Intake/Output Summary (Last 24 hours) at 07/18/2018 0846 Last data filed at 07/18/2018  0800 Gross per 24 hour  Intake 827.89 ml  Output 750 ml  Net 77.89 ml   Filed Weights   07/17/18 1900 07/18/18 0500  Weight: 44.5 kg 46.1 kg   There is no height or weight on file to calculate BMI.  General:  Well nourished, well developed, in no acute distress HEENT: normal Lymph: no adenopathy Neck: no JVD Endocrine:  No thryomegaly Vascular: No carotid bruits; FA pulses 2+ bilaterally without bruits  Cardiac: Bradycardic, 2 out of 6 systolic murmur at the base Lungs:  clear to auscultation bilaterally, no wheezing, rhonchi or rales  Abd: soft, nontender, no hepatomegaly  Ext: no edema Musculoskeletal:  No deformities, BUE and BLE strength normal and equal Skin: warm and dry  Neuro:  CNs 2-12 intact, no focal abnormalities noted Psych:  Normal affect   EKG:  The EKG was personally reviewed and demonstrates: Sinus bradycardia, left bundle branch block Telemetry:  Telemetry was personally reviewed and demonstrates: Sinus bradycardia, nonsustained VT  Relevant CV Studies: LHC 07/18/18  There is mild to moderate left ventricular systolic dysfunction. The left ventricular ejection fraction is 35-45% by visual estimate -difficult to assess due to ectopy  LV end diastolic pressure is moderately elevated.  There is mild (2+) mitral regurgitation.  -------------------------------------  Prox LAD-1 lesion is 80% stenosed. Prox LAD-2 lesion is 70% stenosed.- between 1st & 2nd Diag. Mid LAD lesion is 100% stenosed after 2nd Diad (competetive flow)  Ost 1st Diag lesion is 50% stenosed.  2nd Diag lesion is 50% stenosed.  LIMA-LAD graft was visualized by angiography and is  normal in caliber. The graft exhibits no disease. There is competitive flow.  Prox Cx lesion is 50% stenosed. Mid Cx lesion is 95% stenosed - chronic  Ost 2nd Mrg lesion is 100% stenosed.  SVG-2nd Mrg graft was visualized by angiography and is large. The graft exhibits minimal luminal irregularities.  Prox RCA to Mid RCA lesion is 100% stenosed.  SVG-RVM graft was visualized by angiography and is large. The graft exhibits minimal luminal irregularities. RVM reaches the inferioapex.  Laboratory Data:  Chemistry Recent Labs  Lab 07/17/18 1528 07/17/18 1539 07/18/18 0408  NA 141 142 140  K 3.7 3.7 3.6  CL 115* 112* 110  CO2 18*  --  20*  GLUCOSE 86 91 73  BUN 8 9 12   CREATININE 0.73 0.60 0.83  CALCIUM 9.0  --  8.3*  GFRNONAA >60  --  >60  GFRAA >60  --  >60  ANIONGAP 8  --  10    Recent Labs  Lab 07/17/18 1528 07/18/18 0408  PROT 6.2* 5.5*  ALBUMIN 2.9* 2.6*  AST 18 17  ALT 11 10  ALKPHOS 48 47  BILITOT 0.7 0.8   Hematology Recent Labs  Lab 07/17/18 1528 07/17/18 1539 07/18/18 0408  WBC 5.9  --  7.1  RBC 3.52*  --  3.35*  HGB 11.3* 11.2* 10.6*  HCT 34.1* 33.0* 33.3*  MCV 96.9  --  99.4  MCH 32.1  --  31.6  MCHC 33.1  --  31.8  RDW 13.2  --  13.3  PLT 214  --  189   Cardiac Enzymes Recent Labs  Lab 07/17/18 1528 07/18/18 0408  TROPONINI <0.03 0.06*   No results for input(s): TROPIPOC in the last 168 hours.  BNPNo results for input(s): BNP, PROBNP in the last 168 hours.  DDimer No results for input(s): DDIMER in the last 168 hours.  Radiology/Studies:  No results found.  Assessment and Plan:   1. Sinus bradycardia: Patient is currently asymptomatic she does have a temporary wire in place but has required no pacing as her temporary wires to 30 bpm.  We Kyrillos Adams plan to pull her temporary wires today.  We Madilynne Mullan have her ambulate and see if she has any symptoms with ambulation.  Due to her dementia, Lakiya Cottam follow off on pacemaker implant at this  time. 2. Coronary artery disease status post prior CABG: Initially presented with chest pain and thought to be a STEMI.  Catheterization showed severe coronary disease in her native arteries, but patent grafts to the LAD and RCA.  No targets for intervention. 3. Ischemic cardiomyopathy: Marzetta Lanza likely be unable to tolerate beta-blockers.  We Jaziah Kwasnik repeat echo today and titrate angiotensin blockers at that point. 4. Hypertension: Overall well controlled.  May need titration of medications at discharge. 5. Dementia: Continue Aricept.   For questions or updates, please contact [place] Please consult [email or web address] for contact info under     Signed, Samay Delcarlo Meredith Leeds, MD  07/18/2018 8:46 AM

## 2018-07-18 NOTE — Progress Notes (Signed)
CRITICAL VALUE ALERT  Critical Value:  Troponin 0.06  Date & Time Notied:  07/18/18 06:35  Provider Notified: Dr. Neena Rhymes text-paged  Orders Received/Actions taken: None. Pt had no c/o CP overnight. VSS. SB 40s with backup rate on TVP. Will f/u with rounding team.

## 2018-07-19 MED ORDER — NITROGLYCERIN 0.4 MG SL SUBL: 0.4000 mg | SUBLINGUAL_TABLET | SUBLINGUAL | 3 refills | Status: AC | PRN

## 2018-07-19 MED ORDER — LISINOPRIL 20 MG PO TABS
20.0000 mg | ORAL_TABLET | Freq: Every day | ORAL | Status: DC
  Administered 2018-07-19: 20 mg via ORAL
  Filled 2018-07-19: qty 1

## 2018-07-19 MED ORDER — LISINOPRIL 20 MG PO TABS: 20.0000 mg | ORAL_TABLET | Freq: Every day | ORAL | 3 refills | Status: AC

## 2018-07-19 NOTE — Discharge Summary (Addendum)
Discharge Summary    Patient ID: [name] MRN: [id number]; DOB: 09/16/1940  Admit date: 07/17/2018 Discharge date: 07/19/2018  Primary Care Provider: No primary care provider on file.  Primary Cardiologist: seen by Dr. Ellyn Hack and Dr. Curt Bears. Plan to set up with Dr. Rayann Heman in [place] office Primary Electrophysiologist:  None   Discharge Diagnoses    Principal Problem:   Symptomatic bradycardia Active Problems:   Acute ST elevation myocardial infarction (STEMI) [facility])   Coronary artery disease involving native coronary artery of native heart with unstable angina pectoris (HCC)   Hx of CABG   Allergies Allergies  Allergen Reactions  . Diazepam Other (See Comments)    Makes her feel odd  . Niacin And Related Other (See Comments)    Unknown reaction    Diagnostic Studies/Procedures    Cath 07/17/2018  There is mild to moderate left ventricular systolic dysfunction. The left ventricular ejection fraction is 35-45% by visual estimate -difficult to assess due to ectopy  LV end diastolic pressure is moderately elevated.  There is mild (2+) mitral regurgitation.  -------------------------------------  Prox LAD-1 lesion is 80% stenosed. Prox LAD-2 lesion is 70% stenosed.- between 1st & 2nd Diag. Mid LAD lesion is 100% stenosed after 2nd Diad (competetive flow)  Ost 1st Diag lesion is 50% stenosed.  2nd Diag lesion is 50% stenosed.  LIMA-LAD graft was visualized by angiography and is normal in caliber. The graft exhibits no disease. There is competitive flow.  Prox Cx lesion is 50% stenosed. Mid Cx lesion is 95% stenosed - chronic  Ost 2nd Mrg lesion is 100% stenosed.  SVG-2nd Mrg graft was visualized by angiography and is large. The graft exhibits minimal luminal irregularities.  Prox RCA to Mid RCA lesion is 100% stenosed.  SVG-RVM graft was visualized by angiography and is large. The graft exhibits minimal luminal irregularities. RVM reaches the  inferioapex.  SUMMARY:  Severe native vessel CAD. -Progression of disease in the LAD beyond the initial lesion involving nongrafted diagonal branch.  Patent SVG-RV M, SVG-OM, LIMA-LAD  Reduced EF, difficult to read due to poor filling and bradycardia with PVCs. Recommend 2D echo.   RECOMMENDATIONS:  Admit to 2 H CV ICU for ongoing monitoring with temporary pacemaker.  Cycle cardiac enzymes. If enzymes are abnormal or recurrent angina, may want to consider the possibility of PCI of the LAD which would involve likely atherectomy of the old LAD lesion  Check 2D echocardiogram  Temporary pacemaker inserted with back-up rate of 30bpm. May require pacing overnight. Have consulted EP.  Recommend Aspirin 81mg  daily for moderate CAD.    Echo 07/18/2018 LV EF: 40% - 45% Study Conclusions  - Left ventricle: The cavity size was normal. Wall thickness was increased in a pattern of mild LVH. Systolic function was mildly to moderately reduced. The estimated ejection fraction was in the range of 40% to 45%. There is hypokinesis of the basal-midinferolateral and inferior myocardium. Doppler parameters are consistent with abnormal left ventricular relaxation (grade 1 diastolic dysfunction). - Aortic valve: Mildly calcified annulus. Trileaflet; mildly thickened, mildly calcified leaflets. Left coronary cusp mobility was restricted. There was moderate stenosis. There was mild regurgitation. Mean gradient (S): 15 mm Hg. Peak gradient (S): 24 mm Hg. VTI ratio of LVOT to aortic valve: 0.33. - Mitral valve: Mildly calcified annulus. There was trivial regurgitation. - Left atrium: The atrium was moderately dilated. - Right atrium: Central venous pressure (est): 15 mm Hg. - Atrial septum: No defect or patent foramen ovale was identified. -  Tricuspid valve: There was mild regurgitation. - Pulmonary arteries: Systolic pressure was moderately increased. PA peak  pressure: 47 mm Hg (S). - Pericardium, extracardiac: There was no pericardial effusion. _____________   History of Present Illness     [name] is a 77 y.o. female with history of probable dementia (per pt, Aricept on ER med list), CAD s/p CABGx3 in 2009 (LIMA-LAD, SVG-acute marginal, SVG-OM), DM, HTN, dyslipidemia, 40-60% RICA by duplex 2009, incomplete LBBB, GERD, esophageal stricture, lower exremity edema who presented to [facility] and was transferred to [facility] as a potential Code STEMI and bradycardia with HR in the 30s-40s.  She remotely has a h/o NICM followed by [facility]. Per a remote note from Dr. Dannielle Burn, subsequent studies at [facility] suggested normalization of LVF both by echocardiography as well as nuclear imaging studies in 2004 and 2006. In 2009 she presented with symptoms of acute CHF and was found to have NSTEMI and EF 30%. Cath demonstrated multivessel disease and she underwent CABGx3 by Dr. Roxan Hockey.   The patient is a poor historian and has dementia. She does not think she is followed by a cardiologist. Her family indicates she wasn't feeling well earlier. They went to lunch and after she got home she had difficulty catching her breath and chest pain that felt like indigestion. She also vomited once. She was given Pepto Bismol but taken to the hospital due to persist symptoms. CP started around 11am per family. She presented to [facility] where EKG identified a possible STEMI with anterior ST upsloping elevation in V2-V3, Q waves inferiorly and TWI in I, aVL. The ST-T changes appear changed since 2015 EKG transmitted. She received 324mg  ASA, heparin bolus and NTG. Presenting BP was 170/58, pulse ox 100%. She was emergently transported from [facility] to the cath lab. She is pain free on arrival. BP 116/66, HR 37. She knows her name and that she is at the hospital but does not know what year or month it is.  Hospital Course     Consultants: Electrophysiology  Patient  was taken urgently to the Cath Lab as a code STEMI.  Cardiac catheterization performed on 07/17/2018 however showed patent LIMA to LAD, patent SVG to RCA marginal, patent SVG to OM 2.  No culprit lesion was identified.  Echocardiogram obtained on 07/18/2018 showed EF 40 to 45%, grade 1 DD, moderate AS, trivial MR, PA peak pressure 47 mmHg.  Note, patient had mild 2+ MR noted on cardiac catheterization.  Postprocedure, given her significant bradycardia, her home carvedilol has been discontinued and she was placed on temporary pacing wire.  Temporary pacing wire was discontinued by Dr. Curt Bears on 07/18/2018.  She has been asymptomatic despite significant bradycardia.  During the hospitalization, she has significant sundowning with confusion and night.  She is unable to tell where she is or the year.   Patient was seen in the morning of 07/19/2018 by Dr. Curt Bears, at which time, her heart rate remained in the high 30s and occasionally in the 40s.  However given her significant dementia and asymptomatic nature of her bradycardia, we are hesitant to pursue pacemaker implantation in this patient.  Dr. Curt Bears recommended to manage conservatively.  I  arrange follow-up with Dr. Rayann Heman in [facility] office.  _____________  Discharge Vitals Blood pressure (!) 152/47, pulse (!) 45, temperature 98.2 F (36.8 C), temperature source Oral, resp. rate 18, weight 45 kg, SpO2 95 %.  Filed Weights   07/17/18 1900 07/18/18 0500 07/19/18 0500  Weight: 44.5  kg 46.1 kg 45 kg    Labs & Radiologic Studies    CBC Recent Labs    07/17/18 1528 07/17/18 1539 07/18/18 0408  WBC 5.9  --  7.1  HGB 11.3* 11.2* 10.6*  HCT 34.1* 33.0* 33.3*  MCV 96.9  --  99.4  PLT 214  --  229   Basic Metabolic Panel Recent Labs    07/17/18 1528 07/17/18 1539 07/18/18 0408  NA 141 142 140  K 3.7 3.7 3.6  CL 115* 112* 110  CO2 18*  --  20*  GLUCOSE 86 91 73  BUN 8 9 12   CREATININE 0.73 0.60 0.83  CALCIUM 9.0  --   8.3*   Liver Function Tests Recent Labs    07/17/18 1528 07/18/18 0408  AST 18 17  ALT 11 10  ALKPHOS 48 47  BILITOT 0.7 0.8  PROT 6.2* 5.5*  ALBUMIN 2.9* 2.6*   No results for input(s): LIPASE, AMYLASE in the last 72 hours. Cardiac Enzymes Recent Labs    07/17/18 1528 07/18/18 0408  TROPONINI <0.03 0.06*   BNP Invalid input(s): POCBNP D-Dimer No results for input(s): DDIMER in the last 72 hours. Hemoglobin A1C Recent Labs    07/17/18 1528  HGBA1C 5.6   Fasting Lipid Panel Recent Labs    07/18/18 0408  CHOL 125  HDL 57  LDLCALC 56  TRIG 62  CHOLHDL 2.2   Thyroid Function Tests Recent Labs    07/18/18 0408  TSH 1.689   _____________  No results found. Disposition   Pt is being discharged home today in good condition.  Follow-up Plans & Appointments    Follow-up Information    Thompson Grayer, MD Follow up.   Specialty:  Cardiology Why:  cardiology scheduler  contact you to arrange a followup, please give [place] a call if you do not hear from [place] in 3 business days.  Contact information: [place] [address] [phone]            Discharge Medications   Allergies as of 07/19/2018      Reactions   Diazepam Other (See Comments)   Makes her feel odd   Niacin And Related Other (See Comments)   Unknown reaction      Medication List    STOP taking these medications   carvedilol 12.5 MG tablet Commonly known as:  COREG     TAKE these medications   alendronate 70 MG tablet Commonly known as:  FOSAMAX Take 70 mg by mouth every Wednesday. Take with a full glass of water on an empty stomach.   aspirin EC 81 MG tablet Take 81 mg by mouth daily.   atorvastatin 40 MG tablet Commonly known as:  LIPITOR Take 40 mg by mouth daily.   bismuth subsalicylate 448 JE/56DJ suspension Commonly known as:  PEPTO BISMOL Take 30 mLs by mouth once.   donepezil 5 MG tablet Commonly known as:  ARICEPT Take 5 mg by mouth  daily.   furosemide 40 MG tablet Commonly known as:  LASIX Take 40 mg by mouth daily.   gemfibrozil 600 MG tablet Commonly known as:  LOPID Take 600 mg by mouth 2 (two) times daily before a meal.   lisinopril 20 MG tablet Commonly known as:  PRINIVIL,ZESTRIL Take 1 tablet (20 mg total) by mouth daily. What changed:    medication strength  how much to take   metFORMIN 500 MG 24 hr tablet Commonly known as:  GLUCOPHAGE-XR  Take 500 mg by mouth daily.   nitroGLYCERIN 0.4 MG SL tablet Commonly known as:  NITROSTAT Place 1 tablet (0.4 mg total) under the tongue every 5 (five) minutes x 3 doses as needed for chest pain.   potassium chloride SA 20 MEQ tablet Commonly known as:  K-DUR,KLOR-CON Take 20 mEq by mouth daily.   sitaGLIPtin 100 MG tablet Commonly known as:  JANUVIA Take 100 mg by mouth daily.        Acute coronary syndrome (MI, NSTEMI, STEMI, etc) this admission?: No.    Outstanding Labs/Studies   N/A  Duration of Discharge Encounter   Greater than 30 minutes including physician time.  Hilbert Corrigan, PA 07/19/2018, 9:54 AM   I have seen and examined this patient with Almyra Deforest.  Agree with above, note added to reflect my findings.  On exam, RRR, bradycardic, no murmurs, lungs clear.  Resented to the hospital initially with chest pain.  Left heart catheterization showed no new coronary artery disease and no intervention was performed.  She was noted to be bradycardic and thus had a temporary wire placed.  Since then her temporary wire has been pulled.  She remains bradycardic, down into the 30s at night, but up into the 50s when she ambulates.  She is minimally symptomatic.  Due to her overall health status and underlying dementia, we  hold off on pacemaker implant.  We  have her follow-up in cardiology clinic.   M.  MD 07/19/2018 10:00 AM

## 2018-07-19 NOTE — Progress Notes (Addendum)
Progress Note  Patient Name: [name] Date of Encounter: 07/19/2018  Primary Cardiologist: Normand Sloop (remotely seen by Dr. Dannielle Burn)  Subjective   Denies any CP or SOB. "it is 1992" "I am in [place]" "I don't know who is the president, I don't follow that kind of stuff"  Inpatient Medications    Scheduled Meds: . aspirin EC  81 mg Oral Daily  . atorvastatin  40 mg Oral q1800  . donepezil  5 mg Oral Daily  . heparin  5,000 Units Subcutaneous Q8H  . lisinopril  10 mg Oral Daily  . sodium chloride flush  3 mL Intravenous Q12H   Continuous Infusions: . sodium chloride 10 mL/hr at 07/18/18 0900   PRN Meds: sodium chloride, acetaminophen, nitroGLYCERIN, ondansetron (ZOFRAN) IV, sodium chloride flush   Vital Signs    Vitals:   07/18/18 1900 07/18/18 2000 07/19/18 0000 07/19/18 0500  BP: (!) 149/68 (!) 163/46 (!) 165/46 (!) 157/50  Pulse: (!) 54   (!) 45  Resp: 14 14 20 17   Temp: 98.5 F (36.9 C) 98.5 F (36.9 C) 98.8 F (37.1 C) 97.7 F (36.5 C)  TempSrc: Oral Oral Oral Axillary  SpO2: 99%     Weight:    45 kg    Intake/Output Summary (Last 24 hours) at 07/19/2018 0729 Last data filed at 07/18/2018 2000 Gross per 24 hour  Intake 600 ml  Output 550 ml  Net 50 ml   Filed Weights   07/17/18 1900 07/18/18 0500 07/19/18 0500  Weight: 44.5 kg 46.1 kg 45 kg    Telemetry    Sinus bradycardia HR high 30s - Personally Reviewed  ECG    Sinus bradycardia with 1st degree AV block - Personally Reviewed  Physical Exam   GEN: No acute distress.   Neck: No JVD Cardiac: RRR, no rubs, or gallops. 2/6 systolic murmur in apex Respiratory: Clear to auscultation bilaterally. GI: Soft, nontender, non-distended  MS: No edema; No deformity. Neuro:  Nonfocal  Psych: flat   Labs    Chemistry Recent Labs  Lab 07/17/18 1528 07/17/18 1539 07/18/18 0408  NA 141 142 140  K 3.7 3.7 3.6  CL 115* 112* 110  CO2 18*  --  20*  GLUCOSE 86 91 73  BUN 8 9 12   CREATININE  0.73 0.60 0.83  CALCIUM 9.0  --  8.3*  PROT 6.2*  --  5.5*  ALBUMIN 2.9*  --  2.6*  AST 18  --  17  ALT 11  --  10  ALKPHOS 48  --  47  BILITOT 0.7  --  0.8  GFRNONAA >60  --  >60  GFRAA >60  --  >60  ANIONGAP 8  --  10     Hematology Recent Labs  Lab 07/17/18 1528 07/17/18 1539 07/18/18 0408  WBC 5.9  --  7.1  RBC 3.52*  --  3.35*  HGB 11.3* 11.2* 10.6*  HCT 34.1* 33.0* 33.3*  MCV 96.9  --  99.4  MCH 32.1  --  31.6  MCHC 33.1  --  31.8  RDW 13.2  --  13.3  PLT 214  --  189    Cardiac Enzymes Recent Labs  Lab 07/17/18 1528 07/18/18 0408  TROPONINI <0.03 0.06*   No results for input(s): TROPIPOC in the last 168 hours.   BNPNo results for input(s): BNP, PROBNP in the last 168 hours.   DDimer No results for input(s): DDIMER in the last 168 hours.  Radiology    No results found.  Cardiac Studies   Cath 07/17/2018  There is mild to moderate left ventricular systolic dysfunction. The left ventricular ejection fraction is 35-45% by visual estimate -difficult to assess due to ectopy  LV end diastolic pressure is moderately elevated.  There is mild (2+) mitral regurgitation.  -------------------------------------  Prox LAD-1 lesion is 80% stenosed. Prox LAD-2 lesion is 70% stenosed.- between 1st & 2nd Diag. Mid LAD lesion is 100% stenosed after 2nd Diad (competetive flow)  Ost 1st Diag lesion is 50% stenosed.  2nd Diag lesion is 50% stenosed.  LIMA-LAD graft was visualized by angiography and is normal in caliber. The graft exhibits no disease. There is competitive flow.  Prox Cx lesion is 50% stenosed. Mid Cx lesion is 95% stenosed - chronic  Ost 2nd Mrg lesion is 100% stenosed.  SVG-2nd Mrg graft was visualized by angiography and is large. The graft exhibits minimal luminal irregularities.  Prox RCA to Mid RCA lesion is 100% stenosed.  SVG-RVM graft was visualized by angiography and is large. The graft exhibits minimal luminal irregularities. RVM  reaches the inferioapex.   SUMMARY:  Severe native vessel CAD. -Progression of disease in the LAD beyond the initial lesion involving nongrafted diagonal branch.  Patent SVG-RV M, SVG-OM, LIMA-LAD  Reduced EF, difficult to read due to poor filling and bradycardia with PVCs.  Recommend 2D echo.   RECOMMENDATIONS:  Admit to 2 H CV ICU for ongoing monitoring with temporary pacemaker.  Cycle cardiac enzymes.  If enzymes are abnormal or recurrent angina, may want to consider the possibility of PCI of the LAD which would involve likely atherectomy of the old LAD lesion  Check 2D echocardiogram  Temporary pacemaker inserted with back-up rate of 30bpm.  May require pacing overnight.  Have consulted EP.  Recommend Aspirin 81mg  daily for moderate CAD.   Echo 07/18/2018 LV EF: 40% -   45%  ------------------------------------------------------------------- Indications:      Bradycardia 427.81.  Chest pain 786.51.  ------------------------------------------------------------------- History:   PMH:   Coronary artery disease.  PMH:   Myocardial infarction.  ------------------------------------------------------------------- Study Conclusions  - Left ventricle: The cavity size was normal. Wall thickness was   increased in a pattern of mild LVH. Systolic function was mildly   to moderately reduced. The estimated ejection fraction was in the   range of 40% to 45%. There is hypokinesis of the   basal-midinferolateral and inferior myocardium. Doppler   parameters are consistent with abnormal left ventricular   relaxation (grade 1 diastolic dysfunction). - Aortic valve: Mildly calcified annulus. Trileaflet; mildly   thickened, mildly calcified leaflets. Left coronary cusp mobility   was restricted. There was moderate stenosis. There was mild   regurgitation. Mean gradient (S): 15 mm Hg. Peak gradient (S): 24   mm Hg. VTI ratio of LVOT to aortic valve: 0.33. - Mitral valve: Mildly  calcified annulus. There was trivial   regurgitation. - Left atrium: The atrium was moderately dilated. - Right atrium: Central venous pressure (est): 15 mm Hg. - Atrial septum: No defect or patent foramen ovale was identified. - Tricuspid valve: There was mild regurgitation. - Pulmonary arteries: Systolic pressure was moderately increased.   PA peak pressure: 47 mm Hg (S). - Pericardium, extracardiac: There was no pericardial effusion.  Patient Profile     77 y.o. female with PMH of dementia, CAD s/p CABG x 3 in 2009 (LIMA-LAD, SVG-acute marginal, SVG-OM), HTN, HLD, DM II, carotid artery disease, incomplete LBBB, esophageal stricture  and LE edema transferred from [facility] as potential code STEMI and bradycardia with HR 30-40s.   Assessment & Plan    1. Bradycardia: temp pacing started in cath lab, removed on 11/30.   - TSH normal. Largely asymptomatic, denies any CP or dizziness, has not ambulated, but HR remain in the 30s overnight. HR goes up to 50s immediately after waking up, but eventually still drop down to high 30s even when she is awake. One episode of tachycardia on telemetry.   2. CAD: initially transferred as code STEMI. Cath showed severe native disease, patent LIMA-LAD, patent SVG-RCA marginal, patent SVG-OM2. No culprit lesion identified.   3. Ischemic cardiomyopathy: not on BB given bradycardia. Home Coreg stopped.   - Echo 07/18/2018 showed EF 40-45%, grade 1 DD, moderate AS, trivial MR, PA peak pressure 47 mmHg   4. HTN: BP borderline high. Increase lisinopril to 20 mg daily.   5. Dementia: confused, significant sun downing per nurse. Tried to get up and stop telemetry against advise. Very approachable this morning, however still confused, think she is in [place] and it is the year of 1992. Continue Aricept   For questions or updates, please contact [facility] Please consult [email or web address] for contact info under        Signed, Almyra Deforest, [place]  07/19/2018, 7:29  AM    I have seen and examined this patient with Almyra Deforest.  Agree with above, note added to reflect my findings.  On exam, bradycardic, regular, lungs clear, no murmurs.  She remains bradycardic, though she is asymptomatic.  Does have a left bundle branch block, but her PR interval is normal.  This is likely due to sinus node dysfunction.  As she is asymptomatic, with her underlying dementia, would not plan for pacemaker implantation.  We Posey Jasmin have her follow-up in cardiology clinic as an outpatient.  We Ellwyn Ergle plan for discharge today with.    Jaking Thayer M. Jontue Crumpacker MD 07/19/2018 9:17 AM

## 2018-07-19 NOTE — Progress Notes (Signed)
Discharged home by wheelchair accompanied by husband. Discharge instructions given to pt. Belongings taken home.

## 2018-07-20 ENCOUNTER — Encounter (HOSPITAL_COMMUNITY): Payer: Self-pay | Admitting: Cardiology

## 2018-07-20 MED FILL — Verapamil HCl IV Soln 2.5 MG/ML: INTRAVENOUS | Qty: 2 | Status: AC

## 2018-07-20 MED FILL — Nitroglycerin IV Soln 100 MCG/ML in D5W: INTRA_ARTERIAL | Qty: 10 | Status: AC

## 2018-07-20 MED FILL — Midazolam HCl Inj 2 MG/2ML (Base Equivalent): INTRAMUSCULAR | Qty: 2 | Status: AC

## 2018-09-18 ENCOUNTER — Encounter: Payer: Self-pay | Admitting: Internal Medicine

## 2018-09-18 ENCOUNTER — Ambulatory Visit: Payer: Medicare Other | Admitting: Internal Medicine

## 2018-09-18 VITALS — BP 142/70 | HR 42 | Ht 63.0 in | Wt 97.8 lb

## 2018-09-18 DIAGNOSIS — I255 Ischemic cardiomyopathy: Secondary | ICD-10-CM

## 2018-09-18 DIAGNOSIS — I10 Essential (primary) hypertension: Secondary | ICD-10-CM | POA: Diagnosis not present

## 2018-09-18 DIAGNOSIS — I25110 Atherosclerotic heart disease of native coronary artery with unstable angina pectoris: Secondary | ICD-10-CM | POA: Diagnosis not present

## 2018-09-18 DIAGNOSIS — R001 Bradycardia, unspecified: Secondary | ICD-10-CM | POA: Diagnosis not present

## 2018-09-18 NOTE — Patient Instructions (Signed)
Medication Instructions:  Continue all current medications.   Labwork: none  Testing/Procedures: none  Follow-Up: 6 months   Any Other Special Instructions Will Be Listed Below (If Applicable).   If you need a refill on your cardiac medications before your next appointment, please call your pharmacy.  

## 2018-09-18 NOTE — Progress Notes (Signed)
PCP: Neale Burly, MD Primary Cardiologist: none Primary EP: Dr Gailen Shelter is a 78 y.o. female who presents today for routine electrophysiology followup.  Since was  Hospitalized 06/2018.  Dr Curt Bears' consult note and cath are reviewed.  She had bradycardia but with her dementia was felt to be a poor candidate for pacing or aggressive measures.  Today, she denies symptoms of palpitations, chest pain, shortness of breath,  lower extremity edema, dizziness, presyncope, or syncope.  The patient is otherwise without complaint today.   Past Medical History:  Diagnosis Date  . CAD (coronary artery disease)    a. Coronary artery bypass grafting x3 using LIMA-LAD, SVG-acute marginal, SVG-OM in 2009.  . Cardiomyopathy ([place])   . Carotid artery stenosis    a. RICA 40-60% by duplex in 2009.  . Diabetes mellitus ([place])   . Dyslipidemia   . Essential hypertension   . GERD (gastroesophageal reflux disease)    a. Gastroesophageal reflux disease with previous esophageal stricture.  . Incomplete left bundle branch block   . Lower extremity edema    Past Surgical History:  Procedure Laterality Date  . ABDOMINAL HYSTERECTOMY    . CORONARY ARTERY BYPASS GRAFT    . LEFT HEART CATH AND CORONARY ANGIOGRAPHY N/A 07/17/2018   Procedure: LEFT HEART CATH AND CORONARY ANGIOGRAPHY;  Surgeon: Leonie Man, MD;  Location: [place] [facility];  Service: Cardiovascular;  Laterality: N/A;  Unable to update procedure for cors and graft.   Marland Kitchen LEFT HEART CATH AND CORS/GRAFTS ANGIOGRAPHY N/A 07/17/2018   Procedure: LEFT HEART CATH AND CORS/GRAFTS ANGIOGRAPHY;  Surgeon: Leonie Man, MD;  Location: [place] [facility];  Service: Cardiovascular;  Laterality: N/A;  . TEMPORARY PACEMAKER N/A 07/17/2018   Procedure: TEMPORARY PACEMAKER;  Surgeon: Leonie Man, MD;  Location: [place] [facility];  Service: Cardiovascular;  Laterality: N/A;    ROS- all systems are reviewed and negatives except as  per HPI above  Current Outpatient Medications  Medication Sig Dispense Refill  . aspirin EC 81 MG tablet Take 81 mg by mouth daily.    Marland Kitchen atorvastatin (LIPITOR) 40 MG tablet Take 40 mg by mouth daily.    Marland Kitchen bismuth subsalicylate (PEPTO BISMOL) 262 MG/15ML suspension Take 30 mLs by mouth once.    . donepezil (ARICEPT) 5 MG tablet Take 5 mg by mouth daily.    . furosemide (LASIX) 40 MG tablet Take 40 mg by mouth daily.    Marland Kitchen gemfibrozil (LOPID) 600 MG tablet Take 600 mg by mouth 2 (two) times daily before a meal.    . lisinopril (PRINIVIL,ZESTRIL) 20 MG tablet Take 1 tablet (20 mg total) by mouth daily. 90 tablet 3  . metFORMIN (GLUCOPHAGE-XR) 500 MG 24 hr tablet Take 500 mg by mouth daily.    . nitroGLYCERIN (NITROSTAT) 0.4 MG SL tablet Place 1 tablet (0.4 mg total) under the tongue every 5 (five) minutes x 3 doses as needed for chest pain. 25 tablet 3  . potassium chloride SA (K-DUR,KLOR-CON) 20 MEQ tablet Take 20 mEq by mouth daily.    . sitaGLIPtin (JANUVIA) 100 MG tablet Take 100 mg by mouth daily.     No current facility-administered medications for this visit.     Physical Exam: Vitals:   09/18/18 1320  BP: (!) 142/70  Pulse: (!) 42  SpO2: 97%  Weight: 97 lb 12.8 oz (44.4 kg)  Height: 5\' 3"  (1.6 m)    GEN- The patient is elderly  appearing, alert  Head- normocephalic, atraumatic Eyes-  Sclera clear, conjunctiva pink Ears- hearing intact Oropharynx- clear Lungs- Clear to ausculation bilaterally, normal work of breathing Heart- bradycardic regular rhythm, no murmurs, rubs or gallops, PMI not laterally displaced GI- soft, NT, ND, + BS Extremities- no clubbing, cyanosis, or edema  Wt Readings from Last 3 Encounters:  09/18/18 97 lb 12.8 oz (44.4 kg)  07/19/18 99 lb 3.3 oz (45 kg)    EKG tracing ordered today is personally reviewed and shows sinus bradycardia 42 bpm, PR 238 msec, QTc 456 msec LBBB with QRS 160 msec  Assessment and Plan:  1. Sinus  bradycardia Asymptomatic No indication for pacing at this time  2. CAD s/p CABG No ischemic symptoms Conservative measures Recent cath reveled patent grafts to LAD and RCA. No targets for intervention  3. Ischemic CM Stable  4. HTN Stable No change required today  Return in 6 months  Thompson Grayer MD, [facility] 09/18/2018 1:50 PM

## 2019-02-26 ENCOUNTER — Encounter: Payer: Medicare Other | Admitting: Internal Medicine

## 2019-03-17 ENCOUNTER — Telehealth: Payer: Self-pay | Admitting: *Deleted

## 2019-03-17 NOTE — Telephone Encounter (Signed)
Patient's husband verbally consented for telehealth visits today with [facility] and understands that her insurance company will be billed for the encounter.   Medications and allergies reviewed Unable to check BP or HR but will check weight

## 2019-03-19 ENCOUNTER — Telehealth (INDEPENDENT_AMBULATORY_CARE_PROVIDER_SITE_OTHER): Payer: Medicare Other | Admitting: Internal Medicine

## 2019-03-19 DIAGNOSIS — I10 Essential (primary) hypertension: Secondary | ICD-10-CM | POA: Diagnosis not present

## 2019-03-19 DIAGNOSIS — I25110 Atherosclerotic heart disease of native coronary artery with unstable angina pectoris: Secondary | ICD-10-CM | POA: Diagnosis not present

## 2019-03-19 DIAGNOSIS — R001 Bradycardia, unspecified: Secondary | ICD-10-CM

## 2019-03-19 NOTE — Progress Notes (Signed)
Electrophysiology TeleHealth Note   Due to national recommendations of social distancing due to [place] 19, an audio telehealth visit is felt to be most appropriate for this patient at this time.  Verbal consent was obtained by me for the telehealth visit today.  The patient does not have capability for a virtual visit.  A phone visit is therefore required today.   Date:  03/19/2019   ID:  [name], DOB 01-14-1941, MRN [id number]  Location: patient's home  Provider location:  [facility]  Evaluation Performed: Follow-up visit  PCP:  Neale Burly, MD   Electrophysiologist:  Dr Rayann Heman  Chief Complaint:  Follow up  History of Present Illness:    [name] is a 78 y.o. female who presents via telehealth conferencing today.  Since last being seen in our clinic, the patient reports doing very well.  Today, she denies symptoms of palpitations, chest pain, shortness of breath,  lower extremity edema, dizziness, presyncope, or syncope.  The patient is otherwise without complaint today.  The patient denies symptoms of fevers, chills, cough, or new SOB worrisome for COVID 19.  Past Medical History:  Diagnosis Date  . CAD (coronary artery disease)    a. Coronary artery bypass grafting x3 using LIMA-LAD, SVG-acute marginal, SVG-OM in 2009.  . Cardiomyopathy ([place])   . Carotid artery stenosis    a. RICA 40-60% by duplex in 2009.  . Diabetes mellitus ([place])   . Dyslipidemia   . Essential hypertension   . GERD (gastroesophageal reflux disease)    a. Gastroesophageal reflux disease with previous esophageal stricture.  . Incomplete left bundle branch block   . Lower extremity edema     Past Surgical History:  Procedure Laterality Date  . ABDOMINAL HYSTERECTOMY    . CORONARY ARTERY BYPASS GRAFT    . LEFT HEART CATH AND CORONARY ANGIOGRAPHY N/A 07/17/2018   Procedure: LEFT HEART CATH AND CORONARY ANGIOGRAPHY;  Surgeon: Leonie Man, MD;  Location: [place] [facility];  Service: Cardiovascular;  Laterality: N/A;  Unable to update procedure for cors and graft.   Marland Kitchen LEFT HEART CATH AND CORS/GRAFTS ANGIOGRAPHY N/A 07/17/2018   Procedure: LEFT HEART CATH AND CORS/GRAFTS ANGIOGRAPHY;  Surgeon: Leonie Man, MD;  Location: [place] [facility];  Service: Cardiovascular;  Laterality: N/A;  . TEMPORARY PACEMAKER N/A 07/17/2018   Procedure: TEMPORARY PACEMAKER;  Surgeon: Leonie Man, MD;  Location: [place] [facility];  Service: Cardiovascular;  Laterality: N/A;    Current Outpatient Medications  Medication Sig Dispense Refill  . aspirin EC 81 MG tablet Take 81 mg by mouth daily.    Marland Kitchen atorvastatin (LIPITOR) 40 MG tablet Take 40 mg by mouth daily.    Marland Kitchen bismuth subsalicylate (PEPTO BISMOL) 262 MG/15ML suspension Take 30 mLs by mouth once.    . donepezil (ARICEPT) 5 MG tablet Take 5 mg by mouth daily.    . furosemide (LASIX) 40 MG tablet Take 40 mg by mouth daily.    Marland Kitchen gemfibrozil (LOPID) 600 MG tablet Take 600 mg by mouth 2 (two) times daily before a meal.    . lisinopril (PRINIVIL,ZESTRIL) 20 MG tablet Take 1 tablet (20 mg total) by mouth daily. 90 tablet 3  . metFORMIN (GLUCOPHAGE-XR) 500 MG 24 hr tablet Take 500 mg by mouth daily.    . Multiple Vitamins-Minerals (ICAPS PO) Take 1 capsule by mouth daily.    . nitroGLYCERIN (NITROSTAT) 0.4 MG SL tablet Place 1 tablet (0.4  mg total) under the tongue every 5 (five) minutes x 3 doses as needed for chest pain. 25 tablet 3  . potassium chloride SA (K-DUR,KLOR-CON) 20 MEQ tablet Take 20 mEq by mouth daily.    . sitaGLIPtin (JANUVIA) 100 MG tablet Take 100 mg by mouth daily.     No current facility-administered medications for this visit.     Allergies:   Diazepam and Niacin and related   Social History:  The patient  reports that she has never smoked. She has never used smokeless tobacco. She reports that she does not drink alcohol or use drugs.   Family History:  The patient's  family history includes Heart  disease in her father.   ROS:  Please see the history of present illness.   All other systems are personally reviewed and negative.    Exam:    Vital Signs:  There were no vitals taken for this visit.  Well sounding    Labs/Other Tests and Data Reviewed:    Recent Labs: 07/18/2018: ALT 10; BUN 12; Creatinine, Ser 0.83; Hemoglobin 10.6; Platelets 189; Potassium 3.6; Sodium 140; TSH 1.689   Wt Readings from Last 3 Encounters:  09/18/18 97 lb 12.8 oz (44.4 kg)  07/19/18 99 lb 3.3 oz (45 kg)       ASSESSMENT & PLAN:    1.  Sinus bradycardia Asymptomatic No current pacing indication  2.  CAD s/p CABG No recent ischemic symptoms Stable No change required today  3.  HTN Stable No change required today   Follow-up:  Me in 1 year    Patient Risk:  after full review of this patients clinical status, I feel that they are at moderate risk at this time.  Today, I have spent 15 minutes with the patient with telehealth technology discussing arrhythmia management .    Army Fossa, MD  03/19/2019 10:37 AM     [facility] [address] [place] [address] [phone] (office) [phone] (fax)

## 2020-03-24 ENCOUNTER — Encounter: Payer: Self-pay | Admitting: Internal Medicine

## 2020-03-24 ENCOUNTER — Ambulatory Visit: Payer: Medicare Other | Admitting: Internal Medicine

## 2020-03-24 VITALS — BP 114/64 | HR 70 | Ht 63.0 in | Wt 96.2 lb

## 2020-03-24 DIAGNOSIS — I25110 Atherosclerotic heart disease of native coronary artery with unstable angina pectoris: Secondary | ICD-10-CM

## 2020-03-24 DIAGNOSIS — I10 Essential (primary) hypertension: Secondary | ICD-10-CM

## 2020-03-24 DIAGNOSIS — R001 Bradycardia, unspecified: Secondary | ICD-10-CM | POA: Diagnosis not present

## 2020-03-24 NOTE — Patient Instructions (Signed)
Medication Instructions:  Continue all current medications.  Labwork: none  Testing/Procedures: none  Follow-Up: 1 year   Any Other Special Instructions Will Be Listed Below (If Applicable).  If you need a refill on your cardiac medications before your next appointment, please call your pharmacy.  

## 2020-03-24 NOTE — Progress Notes (Signed)
PCP: Neale Burly, MD   Primary EP: Dr Carollee Massed is a 79 y.o. female who presents today for routine electrophysiology followup.  Since last being seen in our clinic, the patient reports doing reasonably well for her age.  She is elderly and frail.  She has occasional postural dizziness if she moves too quickly.  Today, she denies symptoms of palpitations, chest pain, shortness of breath,  lower extremity edema, presyncope, or syncope.  The patient is otherwise without complaint today.   Past Medical History:  Diagnosis Date  . CAD (coronary artery disease)    a. Coronary artery bypass grafting x3 using LIMA-LAD, SVG-acute marginal, SVG-OM in 2009.  . Cardiomyopathy ([place])   . Carotid artery stenosis    a. RICA 40-60% by duplex in 2009.  . Diabetes mellitus ([place])   . Dyslipidemia   . Essential hypertension   . GERD (gastroesophageal reflux disease)    a. Gastroesophageal reflux disease with previous esophageal stricture.  . Incomplete left bundle branch block   . Lower extremity edema    Past Surgical History:  Procedure Laterality Date  . ABDOMINAL HYSTERECTOMY    . CORONARY ARTERY BYPASS GRAFT    . LEFT HEART CATH AND CORONARY ANGIOGRAPHY N/A 07/17/2018   Procedure: LEFT HEART CATH AND CORONARY ANGIOGRAPHY;  Surgeon: Leonie Man, MD;  Location: [place] [facility];  Service: Cardiovascular;  Laterality: N/A;  Unable to update procedure for cors and graft.   Marland Kitchen LEFT HEART CATH AND CORS/GRAFTS ANGIOGRAPHY N/A 07/17/2018   Procedure: LEFT HEART CATH AND CORS/GRAFTS ANGIOGRAPHY;  Surgeon: Leonie Man, MD;  Location: [place] [facility];  Service: Cardiovascular;  Laterality: N/A;  . TEMPORARY PACEMAKER N/A 07/17/2018   Procedure: TEMPORARY PACEMAKER;  Surgeon: Leonie Man, MD;  Location: [place] [facility];  Service: Cardiovascular;  Laterality: N/A;    ROS- all systems are reviewed and negatives except as per HPI above  Current Outpatient Medications   Medication Sig Dispense Refill  . aspirin EC 81 MG tablet Take 81 mg by mouth daily.    Marland Kitchen atorvastatin (LIPITOR) 40 MG tablet Take 40 mg by mouth daily.    Marland Kitchen bismuth subsalicylate (PEPTO BISMOL) 262 MG/15ML suspension Take 30 mLs by mouth once.    . donepezil (ARICEPT) 5 MG tablet Take 5 mg by mouth daily.    . furosemide (LASIX) 40 MG tablet Take 40 mg by mouth daily.    Marland Kitchen gemfibrozil (LOPID) 600 MG tablet Take 600 mg by mouth 2 (two) times daily before a meal.    . lisinopril (PRINIVIL,ZESTRIL) 20 MG tablet Take 1 tablet (20 mg total) by mouth daily. 90 tablet 3  . metFORMIN (GLUCOPHAGE-XR) 500 MG 24 hr tablet Take 500 mg by mouth daily.    . Multiple Vitamins-Minerals (ICAPS PO) Take 1 capsule by mouth daily.    . nitroGLYCERIN (NITROSTAT) 0.4 MG SL tablet Place 1 tablet (0.4 mg total) under the tongue every 5 (five) minutes x 3 doses as needed for chest pain. 25 tablet 3  . potassium chloride SA (K-DUR,KLOR-CON) 20 MEQ tablet Take 20 mEq by mouth daily.    . sitaGLIPtin (JANUVIA) 100 MG tablet Take 100 mg by mouth daily.     No current facility-administered medications for this visit.    Physical Exam: Vitals:   03/24/20 1048  BP: 114/64  Pulse: 70  SpO2: 99%  Weight: 96 lb 3.2 oz (43.6 kg)  Height: 5\' 3"  (1.6 m)  GEN- The patient is elderly and frail appearing, alert and oriented x 3 today.   Head- normocephalic, atraumatic Eyes-  Sclera clear, conjunctiva pink Ears- hearing intact Oropharynx- clear Lungs-  normal work of breathing Heart- Regular rate and rhythm  GI- soft  Extremities- no clubbing, cyanosis, or edema MS- diffuse atrophy  Wt Readings from Last 3 Encounters:  03/24/20 96 lb 3.2 oz (43.6 kg)  09/18/18 97 lb 12.8 oz (44.4 kg)  07/19/18 99 lb 3.3 oz (45 kg)    EKG tracing ordered today is personally reviewed and shows sinus with LAA, LBBB  Assessment and Plan:  1. Sinus bradycardia Asymptomatic No indication for pacing at this time  2.  HTN Stable No change required today  3. CAD No ischemic symptoms  Risks, benefits and potential toxicities for medications prescribed and/or refilled reviewed with patient today.   Return in a year to see cardiology APP I will see when needed  Thompson Grayer MD, [facility] 03/24/2020 11:05 AM

## 2021-09-06 ENCOUNTER — Ambulatory Visit (INDEPENDENT_AMBULATORY_CARE_PROVIDER_SITE_OTHER): Payer: Medicare Other | Admitting: Orthopaedic Surgery

## 2021-09-06 ENCOUNTER — Ambulatory Visit: Payer: Medicare Other | Admitting: Orthopaedic Surgery

## 2021-09-06 ENCOUNTER — Other Ambulatory Visit: Payer: Self-pay

## 2021-09-06 DIAGNOSIS — S82244D Nondisplaced spiral fracture of shaft of right tibia, subsequent encounter for closed fracture with routine healing: Secondary | ICD-10-CM | POA: Diagnosis not present

## 2021-09-06 DIAGNOSIS — S82201A Unspecified fracture of shaft of right tibia, initial encounter for closed fracture: Secondary | ICD-10-CM | POA: Insufficient documentation

## 2021-09-06 NOTE — Progress Notes (Signed)
Office Visit Note   Patient: [name]           Date of Birth: [birth year]           MRN: [id number] Visit Date: 09/06/2021              Requested by: Neale Burly, MD [place],  [place] [id number] PCP: Neale Burly, MD   Assessment & Plan: Visit Diagnoses:  1. Closed nondisplaced spiral fracture of shaft of right tibia with routine healing, subsequent encounter     Plan: Return 3 weeks for cast off x-rays and exam.  Follow-Up Instructions: Return in about 3 weeks (around 09/27/2021).   Orders:  No orders of the defined types were placed in this encounter.  No orders of the defined types were placed in this encounter.     Procedures: No procedures performed   Clinical Data: No additional findings.   Subjective: Chief Complaint  Patient presents with   Right Leg - Fracture    Fall 08/15/2021    HPI 81 year old female here with her husband who had a fall on 08/15/2021.  She eventually was referred to [facility].  She was seen at [facility] she was placed in a long-leg cast for a nondisplaced right tibial fracture.  Past history of fracture of her right ankle treated by Dr. Manley Mason at [facility] in [place] with bimalleolar fixation ankle had been doing well.  She is at other falls.  Patient is minimal ambulator she is in a wheelchair and husband states she just occasionally stood up in the day she fractured she stood up and then fell.  Patient has memory loss is in an skilled facility and is brought in here for follow-up.  Review of Systems history of CABG history of coronary artery disease STEMI, Alzheimer's.  All other systems noncontributory.   Objective: Vital Signs: There were no vitals taken for this visit.  Physical Exam Constitutional:      Comments: Thin chronically ill in a wheelchair poor memory minimally conversant.  Pleasant.  HENT:     Nose: Nose normal.  Eyes:     Extraocular Movements: Extraocular movements intact.   Pulmonary:     Effort: Pulmonary effort is normal.    Ortho Exam long-leg cast right leg extends just past mid thigh.  Cast is in good condition good cast fit.  Specialty Comments:  No specialty comments available.  Imaging: No results found.   PMFS History: Patient Active Problem List   Diagnosis Date Noted   Fracture of tibial shaft, right, closed 09/06/2021   Acute ST elevation myocardial infarction (STEMI) ([place]) 07/17/2018   Symptomatic bradycardia 07/17/2018   Coronary artery disease involving native coronary artery of native heart with unstable angina pectoris ([place]) 07/17/2009   Hx of CABG 07/17/2009   Past Medical History:  Diagnosis Date   CAD (coronary artery disease)    a. Coronary artery bypass grafting x3 using LIMA-LAD, SVG-acute marginal, SVG-OM in 2009.   Cardiomyopathy ([place])    Carotid artery stenosis    a. RICA 40-60% by duplex in 2009.   Diabetes mellitus ([place])    Dyslipidemia    Essential hypertension    GERD (gastroesophageal reflux disease)    a. Gastroesophageal reflux disease with previous esophageal stricture.   Incomplete left bundle branch block    Lower extremity edema     Family History  Problem Relation Age of Onset   Heart disease Father     Past  Surgical History:  Procedure Laterality Date   ABDOMINAL HYSTERECTOMY     CORONARY ARTERY BYPASS GRAFT     LEFT HEART CATH AND CORONARY ANGIOGRAPHY N/A 07/17/2018   Procedure: LEFT HEART CATH AND CORONARY ANGIOGRAPHY;  Surgeon: Leonie Man, MD;  Location: [facility];  Service: Cardiovascular;  Laterality: N/A;  Unable to update procedure for cors and graft.    LEFT HEART CATH AND CORS/GRAFTS ANGIOGRAPHY N/A 07/17/2018   Procedure: LEFT HEART CATH AND CORS/GRAFTS ANGIOGRAPHY;  Surgeon: Leonie Man, MD;  Location: [facility];  Service: Cardiovascular;  Laterality: N/A;   TEMPORARY PACEMAKER N/A 07/17/2018   Procedure: TEMPORARY PACEMAKER;  Surgeon: Leonie Man, MD;   Location: [facility] [facility];  Service: Cardiovascular;  Laterality: N/A;   Social History   Occupational History   Not on file  Tobacco Use   Smoking status: Never   Smokeless tobacco: Never  Substance and Sexual Activity   Alcohol use: Never   Drug use: Never   Sexual activity: Not on file

## 2021-09-27 ENCOUNTER — Ambulatory Visit (INDEPENDENT_AMBULATORY_CARE_PROVIDER_SITE_OTHER): Payer: Medicare Other | Admitting: Orthopaedic Surgery

## 2021-09-27 ENCOUNTER — Ambulatory Visit: Payer: Self-pay

## 2021-09-27 ENCOUNTER — Other Ambulatory Visit: Payer: Self-pay

## 2021-09-27 ENCOUNTER — Encounter: Payer: Self-pay | Admitting: Orthopaedic Surgery

## 2021-09-27 VITALS — Ht 63.0 in | Wt 96.0 lb

## 2021-09-27 DIAGNOSIS — S82244D Nondisplaced spiral fracture of shaft of right tibia, subsequent encounter for closed fracture with routine healing: Secondary | ICD-10-CM | POA: Diagnosis not present

## 2021-09-27 NOTE — Progress Notes (Signed)
Office Visit Note   Patient: [name]           Date of Birth: 06-16-1941           MRN: [id number] Visit Date: 09/27/2021              Requested by: Neale Burly, MD [place],  [place] [id number] PCP: Neale Burly, MD   Assessment & Plan: Visit Diagnoses:  1. Closed nondisplaced spiral fracture of shaft of right tibia with routine healing, subsequent encounter     Plan: Long-leg cast removed.  X-rays demonstrate some partial healing.  Cam boot applied and she will use it when she stands or pivots.  It can be removed to wash her leg and then reapply.  Return in 6 weeks repeat AP x-ray proximal tibia right leg single view only.  Follow-Up Instructions: No follow-ups on file.   Orders:  Orders Placed This Encounter  Procedures   XR Tibia/Fibula Right   No orders of the defined types were placed in this encounter.     Procedures: No procedures performed   Clinical Data: No additional findings.   Subjective: Chief Complaint  Patient presents with   Right Leg - Fracture, Follow-up    Fall 08/15/2021    HPI 81 year old female follow-up spiral tibia fracture from fall 08/15/2021.  She has been in a long-leg cast originally applied in [place].  Cast removed x-rays demonstrate some partial healing but fracture line still visible.  General osteopenia and old bimalleolar ankle fracture fixation noted.  Review of Systems updated unchanged   Objective: Vital Signs: Ht 5\' 3"  (1.6 m)    Wt 96 lb (43.5 kg)    BMI 17.01 kg/m   Physical Exam unchanged.  Chronic ill-appearing.  No dyspnea.  Flexion cervical spine.  Ortho Exam mild tenderness over the fracture site at the pes bursa.  No fibular tenderness.  Specialty Comments:  No specialty comments available.  Imaging: No results found.   PMFS History: Patient Active Problem List   Diagnosis Date Noted   Fracture of tibial shaft, right, closed 09/06/2021   Acute ST elevation  myocardial infarction (STEMI) ([place]) 07/17/2018   Symptomatic bradycardia 07/17/2018   Coronary artery disease involving native coronary artery of native heart with unstable angina pectoris ([place]) 07/17/2009   Hx of CABG 07/17/2009   Past Medical History:  Diagnosis Date   CAD (coronary artery disease)    a. Coronary artery bypass grafting x3 using LIMA-LAD, SVG-acute marginal, SVG-OM in 2009.   Cardiomyopathy ([place])    Carotid artery stenosis    a. RICA 40-60% by duplex in 2009.   Diabetes mellitus ([place])    Dyslipidemia    Essential hypertension    GERD (gastroesophageal reflux disease)    a. Gastroesophageal reflux disease with previous esophageal stricture.   Incomplete left bundle branch block    Lower extremity edema     Family History  Problem Relation Age of Onset   Heart disease Father     Past Surgical History:  Procedure Laterality Date   ABDOMINAL HYSTERECTOMY     CORONARY ARTERY BYPASS GRAFT     LEFT HEART CATH AND CORONARY ANGIOGRAPHY N/A 07/17/2018   Procedure: LEFT HEART CATH AND CORONARY ANGIOGRAPHY;  Surgeon: Leonie Man, MD;  Location: [facility];  Service: Cardiovascular;  Laterality: N/A;  Unable to update procedure for cors and graft.    LEFT HEART CATH AND CORS/GRAFTS ANGIOGRAPHY N/A 07/17/2018  Procedure: LEFT HEART CATH AND CORS/GRAFTS ANGIOGRAPHY;  Surgeon: Leonie Man, MD;  Location: [facility];  Service: Cardiovascular;  Laterality: N/A;   TEMPORARY PACEMAKER N/A 07/17/2018   Procedure: TEMPORARY PACEMAKER;  Surgeon: Leonie Man, MD;  Location: [facility];  Service: Cardiovascular;  Laterality: N/A;   Social History   Occupational History   Not on file  Tobacco Use   Smoking status: Never   Smokeless tobacco: Never  Substance and Sexual Activity   Alcohol use: Never   Drug use: Never   Sexual activity: Not on file

## 2021-09-28 ENCOUNTER — Telehealth: Payer: Self-pay | Admitting: Radiology

## 2021-09-28 NOTE — Telephone Encounter (Signed)
I called Ben and advised.

## 2021-09-28 NOTE — Telephone Encounter (Addendum)
Please see message from [place] office below and advise. I thought that patient was to be WBAT in CAM boot, however, wanted to check to be sure. Thanks.   [place], Carlon Karie Fetch, [place], RT; [place], [place], [employer] with [facility] is calling about weightbearing status for this patient. Looking at OV note, I was unsure what to tell him.Please call him at [phone]

## 2021-11-08 ENCOUNTER — Ambulatory Visit: Payer: Medicare Other | Admitting: Orthopaedic Surgery

## 2021-11-08 ENCOUNTER — Other Ambulatory Visit: Payer: Self-pay

## 2021-11-15 ENCOUNTER — Ambulatory Visit (INDEPENDENT_AMBULATORY_CARE_PROVIDER_SITE_OTHER): Payer: Medicare Other | Admitting: Orthopaedic Surgery

## 2021-11-15 ENCOUNTER — Ambulatory Visit: Payer: Self-pay

## 2021-11-15 ENCOUNTER — Encounter: Payer: Self-pay | Admitting: Orthopaedic Surgery

## 2021-11-15 ENCOUNTER — Ambulatory Visit (INDEPENDENT_AMBULATORY_CARE_PROVIDER_SITE_OTHER): Payer: Medicare Other

## 2021-11-15 VITALS — Ht 63.0 in | Wt 96.0 lb

## 2021-11-15 DIAGNOSIS — S82244D Nondisplaced spiral fracture of shaft of right tibia, subsequent encounter for closed fracture with routine healing: Secondary | ICD-10-CM

## 2021-11-15 DIAGNOSIS — M25562 Pain in left knee: Secondary | ICD-10-CM | POA: Diagnosis not present

## 2021-11-15 NOTE — Progress Notes (Signed)
? ?Office Visit Note ?  ?Patient: [name]           ?Date of Birth: [birth year]           ?MRN: [id number] ?Visit Date: 11/15/2021 ?             ?Requested by: Neale Burly, MD ?[place] ?[place],  [place] [address] ?PCP: Neale Burly, MD ? ? ?Assessment & Plan: ?Visit Diagnoses:  ?1. Closed nondisplaced spiral fracture of shaft of right tibia with routine healing, subsequent encounter   ?2. Acute pain of left knee   ? ? ?Plan: He can discontinue boot is causing some skin problems in her ankle.  Spends most the day in bed and family is trying to find a place for her for transfer of care.  Tibia fracture is healed adequately discontinue cam boot and office follow-up as needed. ? ?Follow-Up Instructions: No follow-ups on file.  ? ?Orders:  ?Orders Placed This Encounter  ?Procedures  ? XR Tibia/Fibula Right  ? XR Knee 1-2 Views Left  ? ?No orders of the defined types were placed in this encounter. ? ? ? ? Procedures: ?No procedures performed ? ? ?Clinical Data: ?No additional findings. ? ? ?Subjective: ?Chief Complaint  ?Patient presents with  ? Right Leg - Fracture, Follow-up  ? Left Knee - Pain  ? ? ?HPI 81 year old female follow-up right leg nondisplaced proximal tibia fracture.  She had already had an old ankle fracture on the same side.  She is complained of pain in the opposite left leg and single x-ray from the knee down the ankle was obtained which shows no fracture left tib-fib. ? ?Review of Systems updated unchanged. ? ? ?Objective: ?Vital Signs: Ht '5\' 3"'$  (1.6 m)   Wt 96 lb (43.5 kg)   BMI 17.01 kg/m?  ? ?Physical Exam patient is in a wheelchair nonambulatory significant thoracic kyphosis head almost down to her lap.  She answers short questions.  Her boot has urine odor.  There is some irritation anteriorly over the ankle without full-thickness skin breakdown from the boot.  No tenderness at the fracture site no swelling of the proximal tibia area. ? ?Ortho Exam ? ?Specialty Comments:   ?No specialty comments available. ? ?Imaging: ?No results found. ? ? ?PMFS History: ?Patient Active Problem List  ? Diagnosis Date Noted  ? Fracture of tibial shaft, right, closed 09/06/2021  ? Acute ST elevation myocardial infarction (STEMI) ([place]) 07/17/2018  ? Symptomatic bradycardia 07/17/2018  ? Coronary artery disease involving native coronary artery of native heart with unstable angina pectoris ([place]) 07/17/2009  ? Hx of CABG 07/17/2009  ? ?Past Medical History:  ?Diagnosis Date  ? CAD (coronary artery disease)   ? a. Coronary artery bypass grafting x3 using LIMA-LAD, SVG-acute marginal, SVG-OM in 2009.  ? Cardiomyopathy ([place])   ? Carotid artery stenosis   ? a. RICA 40-60% by duplex in 2009.  ? Diabetes mellitus ([place])   ? Dyslipidemia   ? Essential hypertension   ? GERD (gastroesophageal reflux disease)   ? a. Gastroesophageal reflux disease with previous esophageal stricture.  ? Incomplete left bundle branch block   ? Lower extremity edema   ?  ?Family History  ?Problem Relation Age of Onset  ? Heart disease Father   ?  ?Past Surgical History:  ?Procedure Laterality Date  ? ABDOMINAL HYSTERECTOMY    ? CORONARY ARTERY BYPASS GRAFT    ? LEFT HEART CATH AND CORONARY ANGIOGRAPHY N/A 07/17/2018  ?  Procedure: LEFT HEART CATH AND CORONARY ANGIOGRAPHY;  Surgeon: Leonie Man, MD;  Location: [facility];  Service: Cardiovascular;  Laterality: N/A;  Unable to update procedure for cors and graft.   ? LEFT HEART CATH AND CORS/GRAFTS ANGIOGRAPHY N/A 07/17/2018  ? Procedure: LEFT HEART CATH AND CORS/GRAFTS ANGIOGRAPHY;  Surgeon: Leonie Man, MD;  Location: [facility];  Service: Cardiovascular;  Laterality: N/A;  ? TEMPORARY PACEMAKER N/A 07/17/2018  ? Procedure: TEMPORARY PACEMAKER;  Surgeon: Leonie Man, MD;  Location: [facility];  Service: Cardiovascular;  Laterality: N/A;  ? ?Social History  ? ?Occupational History  ? Not on file  ?Tobacco Use  ? Smoking status: Never  ? Smokeless  tobacco: Never  ?Substance and Sexual Activity  ? Alcohol use: Never  ? Drug use: Never  ? Sexual activity: Not on file  ? ? ? ? ? ? ?

## 2022-09-19 DEATH — deceased
